# Patient Record
Sex: Male | Born: 1975 | Race: White | Hispanic: No | Marital: Married | Smoking: Never smoker
Health system: Southern US, Community
[De-identification: ages and names within clinical notes are randomized; demographics above are authoritative.]

## PROBLEM LIST (undated history)

## (undated) DIAGNOSIS — F419 Anxiety disorder, unspecified: Secondary | ICD-10-CM

## (undated) DIAGNOSIS — K219 Gastro-esophageal reflux disease without esophagitis: Secondary | ICD-10-CM

## (undated) HISTORY — PX: KNEE SURGERY: SHX244

## (undated) HISTORY — PX: HEMORRHOID SURGERY: SHX153

---

## 2010-08-16 DIAGNOSIS — K2100 Gastro-esophageal reflux disease with esophagitis, without bleeding: Secondary | ICD-10-CM | POA: Insufficient documentation

## 2010-08-16 DIAGNOSIS — J4520 Mild intermittent asthma, uncomplicated: Secondary | ICD-10-CM | POA: Insufficient documentation

## 2010-08-16 DIAGNOSIS — G43009 Migraine without aura, not intractable, without status migrainosus: Secondary | ICD-10-CM | POA: Insufficient documentation

## 2011-10-23 ENCOUNTER — Emergency Department (HOSPITAL_BASED_OUTPATIENT_CLINIC_OR_DEPARTMENT_OTHER)
Admission: EM | Admit: 2011-10-23 | Discharge: 2011-10-23 | Disposition: A | Discharge status: Home / Self Care | Payer: 59 | Attending: Emergency Medicine | Admitting: Emergency Medicine

## 2011-10-23 ENCOUNTER — Encounter (HOSPITAL_BASED_OUTPATIENT_CLINIC_OR_DEPARTMENT_OTHER): Payer: Self-pay | Admitting: *Deleted

## 2011-10-23 DIAGNOSIS — K6289 Other specified diseases of anus and rectum: Secondary | ICD-10-CM

## 2011-10-23 DIAGNOSIS — Z880 Allergy status to penicillin: Secondary | ICD-10-CM | POA: Insufficient documentation

## 2011-10-23 DIAGNOSIS — Z882 Allergy status to sulfonamides status: Secondary | ICD-10-CM | POA: Insufficient documentation

## 2011-10-23 DIAGNOSIS — F411 Generalized anxiety disorder: Secondary | ICD-10-CM | POA: Insufficient documentation

## 2011-10-23 DIAGNOSIS — K645 Perianal venous thrombosis: Secondary | ICD-10-CM

## 2011-10-23 HISTORY — DX: Anxiety disorder, unspecified: F41.9

## 2011-10-23 MED ORDER — OXYCODONE-ACETAMINOPHEN 5-325 MG PO TABS: 1.0000 | ORAL_TABLET | Freq: Four times a day (QID) | ORAL | Status: AC | PRN

## 2011-10-23 MED ORDER — MORPHINE SULFATE 4 MG/ML IJ SOLN
8.0000 mg | Freq: Once | INTRAMUSCULAR | Status: AC
  Administered 2011-10-23: 8 mg via INTRAMUSCULAR
  Filled 2011-10-23: qty 2

## 2011-10-23 NOTE — ED Notes (Signed)
Had a thrombosed hemorrhoid removed today. Bleeding wont stop. He is having problems urinating and rectal pain.

## 2011-10-23 NOTE — ED Provider Notes (Signed)
History     CSN: [id number]  Arrival date & time 10/23/11  9562   First MD Initiated Contact with Patient 10/23/11 1908      Chief Complaint  Patient presents with  . Rectal Pain    (Consider location/radiation/quality/duration/timing/severity/associated sxs/prior treatment) HPI Comments: Patient had thrombosed external hemorrhoids removed today.  Is having severe pain since that time.  He called the surgeons who told him to take motrin which has not helped.  They continue to bleed.  The history is provided by the patient.    Past Medical History  Diagnosis Date  . Anxiety     Past Surgical History  Procedure Date  . Hemorrhoid surgery   . Knee surgery     No family history on file.  History  Substance Use Topics  . Smoking status: Never Smoker   . Smokeless tobacco: Not on file  . Alcohol Use: Yes      Review of Systems  All other systems reviewed and are negative.    Allergies  Sulfa antibiotics and Penicillins  Home Medications   Current Outpatient Rx  Name Route Sig Dispense Refill  . AMITRIPTYLINE HCL 50 MG PO TABS Oral Take 75 mg by mouth at bedtime.    Marland Kitchen CLONAZEPAM 0.5 MG PO TABS Oral Take 0.5 mg by mouth daily.    Marland Kitchen HYDROCODONE-ACETAMINOPHEN 5-500 MG PO TABS Oral Take 1 tablet by mouth once as needed. For pain    . IBUPROFEN 200 MG PO TABS Oral Take 400 mg by mouth every 6 (six) hours as needed. For pain    . RANITIDINE HCL 150 MG PO TABS Oral Take 150 mg by mouth 2 (two) times daily.      BP 149/90  Pulse 99  Temp 97.5 F (36.4 C) (Oral)  Resp 20  SpO2 99%  Physical Exam  Nursing note and vitals reviewed. Constitutional: He is oriented to person, place, and time. He appears well-developed and well-nourished. No distress.       Appears uncomfortable.  HENT:  Head: Normocephalic and atraumatic.  Neck: Normal range of motion. Neck supple.  Genitourinary:       There are incisions present on the rectum which continue to ooze, but not  actively bleed.    Neurological: He is alert and oriented to person, place, and time.  Skin: Skin is warm and dry. He is not diaphoretic.    ED Course  Procedures (including critical care time)  Labs Reviewed - No data to display No results found.   No diagnosis found.    MDM  Will give pain meds, recommend stool softeners.  If he is still in this much pain tomorrow, he needs to see his Careers adviser.        Geoffery Lyons, MD 10/23/11 1919

## 2011-10-23 NOTE — ED Notes (Signed)
States has not taken gauze out but thinks he is bleeding pretty good.  Also states he cannot urinate and requested a catheter.

## 2011-12-03 ENCOUNTER — Emergency Department (HOSPITAL_BASED_OUTPATIENT_CLINIC_OR_DEPARTMENT_OTHER)
Admission: EM | Admit: 2011-12-03 | Discharge: 2011-12-03 | Disposition: A | Discharge status: Home / Self Care | Payer: 59 | Attending: Emergency Medicine | Admitting: Emergency Medicine

## 2011-12-03 ENCOUNTER — Emergency Department (HOSPITAL_BASED_OUTPATIENT_CLINIC_OR_DEPARTMENT_OTHER): Payer: 59

## 2011-12-03 ENCOUNTER — Encounter (HOSPITAL_BASED_OUTPATIENT_CLINIC_OR_DEPARTMENT_OTHER): Payer: Self-pay | Admitting: Emergency Medicine

## 2011-12-03 DIAGNOSIS — L0390 Cellulitis, unspecified: Secondary | ICD-10-CM

## 2011-12-03 DIAGNOSIS — Z79899 Other long term (current) drug therapy: Secondary | ICD-10-CM | POA: Insufficient documentation

## 2011-12-03 DIAGNOSIS — Z9889 Other specified postprocedural states: Secondary | ICD-10-CM | POA: Insufficient documentation

## 2011-12-03 DIAGNOSIS — IMO0002 Reserved for concepts with insufficient information to code with codable children: Secondary | ICD-10-CM | POA: Insufficient documentation

## 2011-12-03 DIAGNOSIS — F411 Generalized anxiety disorder: Secondary | ICD-10-CM | POA: Insufficient documentation

## 2011-12-03 MED ORDER — TRAMADOL HCL 50 MG PO TABS
50.0000 mg | ORAL_TABLET | Freq: Once | ORAL | Status: AC
  Administered 2011-12-03: 50 mg via ORAL
  Filled 2011-12-03: qty 1

## 2011-12-03 MED ORDER — DOXYCYCLINE HYCLATE 100 MG PO TABS
100.0000 mg | ORAL_TABLET | Freq: Once | ORAL | Status: AC
  Administered 2011-12-03: 100 mg via ORAL
  Filled 2011-12-03: qty 1

## 2011-12-03 MED ORDER — HYDROCODONE-ACETAMINOPHEN 5-500 MG PO TABS: 1.0000 | ORAL_TABLET | Freq: Four times a day (QID) | ORAL | Status: DC | PRN

## 2011-12-03 MED ORDER — KETOROLAC TROMETHAMINE 60 MG/2ML IM SOLN
60.0000 mg | Freq: Once | INTRAMUSCULAR | Status: AC
  Administered 2011-12-03: 60 mg via INTRAMUSCULAR
  Filled 2011-12-03: qty 2

## 2011-12-03 MED ORDER — DOXYCYCLINE HYCLATE 100 MG PO CAPS: 100.0000 mg | ORAL_CAPSULE | Freq: Two times a day (BID) | ORAL | Status: DC

## 2011-12-03 MED ORDER — LIDOCAINE HCL 2 % IJ SOLN
INTRAMUSCULAR | Status: AC
  Filled 2011-12-03: qty 20

## 2011-12-03 NOTE — ED Notes (Signed)
Pt /o swelling and pain in right elbow x 3 days

## 2011-12-03 NOTE — ED Provider Notes (Signed)
History     CSN: [id number]  Arrival date & time 12/03/11  0139   First MD Initiated Contact with Patient 12/03/11 0158      Chief Complaint  Patient presents with  . Joint Swelling    (Consider location/radiation/quality/duration/timing/severity/associated sxs/prior treatment) Patient is a 36 y.o. male presenting with rash.  Rash  This is a new problem. The current episode started more than 2 days ago. The problem has been gradually worsening. Associated with: gets burned on skin from welding. There has been no fever. Affected Location: right elbow. The pain is severe. The pain has been worsening since onset. Associated symptoms include pain. He has tried nothing for the symptoms. The treatment provided no relief.  Redness and swelling over the right elbow x 3 days.  No f/c/r.  No n/v/d.  No streaking up the arm nor gland swelling.    Past Medical History  Diagnosis Date  . Anxiety     Past Surgical History  Procedure Date  . Hemorrhoid surgery   . Knee surgery     No family history on file.  History  Substance Use Topics  . Smoking status: Never Smoker   . Smokeless tobacco: Not on file  . Alcohol Use: Yes      Review of Systems  Constitutional: Negative for fever.  Musculoskeletal: Positive for arthralgias.  Skin: Positive for rash.  All other systems reviewed and are negative.    Allergies  Sulfa antibiotics and Penicillins  Home Medications   Current Outpatient Rx  Name Route Sig Dispense Refill  . AMITRIPTYLINE HCL 50 MG PO TABS Oral Take 75 mg by mouth at bedtime.    Marland Kitchen CLONAZEPAM 0.5 MG PO TABS Oral Take 0.5 mg by mouth daily.    Marland Kitchen RANITIDINE HCL 150 MG PO TABS Oral Take 150 mg by mouth 2 (two) times daily.    Marland Kitchen HYDROCODONE-ACETAMINOPHEN 5-500 MG PO TABS Oral Take 1 tablet by mouth once as needed. For pain    . IBUPROFEN 200 MG PO TABS Oral Take 400 mg by mouth every 6 (six) hours as needed. For pain      BP 132/81  Pulse 104  Temp 97.1 F  (36.2 C)  Resp 18  Ht 5\' 6"  (1.676 m)  Wt 158 lb (71.668 kg)  BMI 25.50 kg/m2  SpO2 99%  Physical Exam  Constitutional: He is oriented to person, place, and time. He appears well-developed and well-nourished. No distress.  HENT:  Head: Normocephalic and atraumatic.  Mouth/Throat: Oropharynx is clear and moist.  Eyes: Conjunctivae normal are normal. Pupils are equal, round, and reactive to light.  Neck: Normal range of motion. Neck supple.  Cardiovascular: Normal rate, regular rhythm and intact distal pulses.   Pulmonary/Chest: Effort normal and breath sounds normal. He has no wheezes. He has no rales.  Abdominal: Soft. Bowel sounds are normal. There is no tenderness. There is no rebound and no guarding.  Musculoskeletal: Normal range of motion.  Lymphadenopathy:    He has no cervical adenopathy.  Neurological: He is alert and oriented to person, place, and time. He has normal reflexes.  Skin: Skin is warm and dry. There is erythema.     Psychiatric: He has a normal mood and affect.    ED Course  Procedures (including critical care time)  Labs Reviewed - No data to display No results found.   No diagnosis found.    MDM  Cellulitis.  Return for recheck in 2 day, sooner for fever  or streaking up the arm. if swelling or decreased range of motion follow up with orthopedics.  Patient verbalizes understanding and agrees to follow up       Crosley Stejskal Smitty Cords, MD 12/03/11 [phone]

## 2011-12-05 ENCOUNTER — Encounter (HOSPITAL_BASED_OUTPATIENT_CLINIC_OR_DEPARTMENT_OTHER): Payer: Self-pay | Admitting: Family Medicine

## 2011-12-05 ENCOUNTER — Emergency Department (HOSPITAL_BASED_OUTPATIENT_CLINIC_OR_DEPARTMENT_OTHER)
Admission: EM | Admit: 2011-12-05 | Discharge: 2011-12-05 | Disposition: A | Discharge status: Home / Self Care | Payer: Managed Care, Other (non HMO) | Attending: Emergency Medicine | Admitting: Emergency Medicine

## 2011-12-05 DIAGNOSIS — M7989 Other specified soft tissue disorders: Secondary | ICD-10-CM | POA: Insufficient documentation

## 2011-12-05 DIAGNOSIS — IMO0002 Reserved for concepts with insufficient information to code with codable children: Secondary | ICD-10-CM | POA: Insufficient documentation

## 2011-12-05 DIAGNOSIS — F411 Generalized anxiety disorder: Secondary | ICD-10-CM | POA: Insufficient documentation

## 2011-12-05 DIAGNOSIS — K219 Gastro-esophageal reflux disease without esophagitis: Secondary | ICD-10-CM | POA: Insufficient documentation

## 2011-12-05 DIAGNOSIS — L0390 Cellulitis, unspecified: Secondary | ICD-10-CM

## 2011-12-05 DIAGNOSIS — Z79899 Other long term (current) drug therapy: Secondary | ICD-10-CM | POA: Insufficient documentation

## 2011-12-05 MED ORDER — ESOMEPRAZOLE MAGNESIUM 40 MG PO CPDR: 40.0000 mg | DELAYED_RELEASE_CAPSULE | Freq: Every day | ORAL | Status: DC

## 2011-12-05 MED ORDER — CEFTRIAXONE SODIUM 1 G IJ SOLR: 1.0000 g | Freq: Once | INTRAMUSCULAR | Status: DC

## 2011-12-05 MED ORDER — LIDOCAINE HCL (PF) 1 % IJ SOLN
INTRAMUSCULAR | Status: AC
  Administered 2011-12-05: 5 mL
  Filled 2011-12-05: qty 5

## 2011-12-05 MED ORDER — CEFTRIAXONE SODIUM 1 G IJ SOLR
1.0000 g | Freq: Once | INTRAMUSCULAR | Status: AC
  Administered 2011-12-05: 1 g via INTRAMUSCULAR

## 2011-12-05 MED ORDER — CEFTRIAXONE SODIUM 1 G IJ SOLR
1.0000 g | Freq: Once | INTRAMUSCULAR | Status: DC
  Filled 2011-12-05: qty 10

## 2011-12-05 MED ORDER — MELOXICAM 15 MG PO TABS: 15.0000 mg | ORAL_TABLET | Freq: Every day | ORAL | Status: DC

## 2011-12-05 NOTE — ED Provider Notes (Signed)
History     CSN: [id number]  Arrival date & time 12/05/11  1129   First MD Initiated Contact with Patient 12/05/11 1208      Chief Complaint  Patient presents with  . Follow-up    (Consider location/radiation/quality/duration/timing/severity/associated sxs/prior treatment) HPI Comments: Quintez Maselli 36 y.o. male   The chief complaint is: Patient presents with:   Follow-up   36 year old male presents for followup on right elbow cellulitis.  Patient was seen today to days ago and has had 2 days of doxycycline.  Patient states that he still has significant pain and swelling.  He states that his elbow was never very red so redness has not decreased significantly.  Patient feels that his elbow is doing "the same."  Patient states that Vicodin does not work well for his pain.  His GERD is upset by 100 mg ibuprofen.  Patient continues to work as he states "I have to work". Denies systemic symptoms.  Denies numbness or tingling in the right hand.  Denies loss of range of motion , but ROM limited due to pain.  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   The history is provided by the patient and medical records. No language interpreter was used.    Past Medical History  Diagnosis Date  . Anxiety     Past Surgical History  Procedure Date  . Hemorrhoid surgery   . Knee surgery     No family history on file.  History  Substance Use Topics  . Smoking status: Never Smoker   . Smokeless tobacco: Not on file  . Alcohol Use: Yes      Review of Systems  Constitutional: Negative for fever and chills.  Respiratory: Negative for cough and shortness of breath.   Cardiovascular: Negative for chest pain and palpitations.  Gastrointestinal: Negative for vomiting, abdominal pain, diarrhea and constipation.  Genitourinary: Negative for dysuria, urgency and frequency.  Musculoskeletal: Positive for joint swelling (right elbow). Negative for myalgias,  arthralgias and gait problem.  Skin: Negative for wound.  Neurological: Negative for numbness and headaches.  All other systems reviewed and are negative.    Allergies  Sulfa antibiotics and Penicillins  Home Medications   Current Outpatient Rx  Name Route Sig Dispense Refill  . AMITRIPTYLINE HCL 50 MG PO TABS Oral Take 75 mg by mouth at bedtime.    Marland Kitchen CLONAZEPAM 0.5 MG PO TABS Oral Take 0.5 mg by mouth daily.    Marland Kitchen DOXYCYCLINE HYCLATE 100 MG PO CAPS Oral Take 1 capsule (100 mg total) by mouth 2 (two) times daily. 14 capsule 0  . HYDROCODONE-ACETAMINOPHEN 5-500 MG PO TABS Oral Take 1 tablet by mouth once as needed. For pain    . HYDROCODONE-ACETAMINOPHEN 5-500 MG PO TABS Oral Take 1 tablet by mouth every 6 (six) hours as needed for pain. 10 tablet 0  . IBUPROFEN 200 MG PO TABS Oral Take 400 mg by mouth every 6 (six) hours as needed. For pain    . RANITIDINE HCL 150 MG PO TABS Oral Take 150 mg by mouth 2 (two) times daily.      BP 132/93  Pulse 93  Temp 98.3 F (36.8 C) (Oral)  Resp 16  SpO2 99%  Physical Exam  Nursing note and vitals reviewed. Constitutional: He appears well-developed and well-nourished. No distress.  HENT:  Head: Normocephalic and atraumatic.  Eyes: Conjunctivae normal are normal. No scleral icterus.  Neck: Normal range of motion. Neck supple.  Cardiovascular: Normal rate, regular rhythm and normal heart sounds.   Pulmonary/Chest: Effort normal and breath sounds normal. No respiratory distress.  Abdominal: Soft. There is no tenderness.  Musculoskeletal: He exhibits edema.       A right elbow exam was performed. SKIN: intact, moderate erythema and wheel on elbow along with small burns to skin SWELLING: none, minimal and erythema  16cm x 10cm EFFUSION: none WARMTH: mildly increased warmth TENDERNESS: moderate, diffuse ROM: full STRENGTH: normal NEUROLOGICAL EXAM: normal VASCULAR EXAM: normal   Neurological: He is alert.  Skin: Skin is warm and dry.  He is not diaphoretic.  Psychiatric: His behavior is normal.    ED Course  Procedures (including critical care time)  Labs Reviewed - No data to display No results found.   No diagnosis found.    MDM  12:45 PM Filed Vitals:   12/05/11 1151  BP: 132/93  Pulse: 93  Temp: 98.3 F (36.8 C)  Resp: 16    Patient seen in shared visit with Dr. Radford Pax.  Patient will receive 1 gm rocephin IM .  PE does not suggest olecranon bursitis or septic joint.  Xray 2 days ago negative. Patient will be discharged with mobic and changed to nexium for GERD sxs.  I have marked the area with surgical pen and asked the patient to watch for resolution.  He should return for follow up if no improvent in 2-3 days.        Arthor Captain, PA-C 12/05/11 2225

## 2011-12-05 NOTE — ED Notes (Signed)
Pt sts he is here for f/u for pain, redness, swelling to right elbow. Pt sts he is taking abx, pain meds, without relief.

## 2011-12-05 NOTE — ED Notes (Signed)
No allergic reaction to the antibiotic.

## 2011-12-10 NOTE — ED Provider Notes (Signed)
Medical screening examination/treatment/procedure(s) were conducted as a shared visit with non-physician practitioner(s) and myself.  I personally evaluated the patient during the encounter    Nelia Shi, MD 12/10/11 2230

## 2012-05-02 ENCOUNTER — Encounter (HOSPITAL_BASED_OUTPATIENT_CLINIC_OR_DEPARTMENT_OTHER): Payer: Self-pay | Admitting: *Deleted

## 2012-05-02 ENCOUNTER — Emergency Department (HOSPITAL_BASED_OUTPATIENT_CLINIC_OR_DEPARTMENT_OTHER): Payer: 59

## 2012-05-02 ENCOUNTER — Emergency Department (HOSPITAL_BASED_OUTPATIENT_CLINIC_OR_DEPARTMENT_OTHER)
Admission: EM | Admit: 2012-05-02 | Discharge: 2012-05-02 | Disposition: A | Discharge status: Home / Self Care | Payer: 59 | Attending: Emergency Medicine | Admitting: Emergency Medicine

## 2012-05-02 DIAGNOSIS — F411 Generalized anxiety disorder: Secondary | ICD-10-CM | POA: Insufficient documentation

## 2012-05-02 DIAGNOSIS — R05 Cough: Secondary | ICD-10-CM | POA: Insufficient documentation

## 2012-05-02 DIAGNOSIS — R042 Hemoptysis: Secondary | ICD-10-CM | POA: Insufficient documentation

## 2012-05-02 DIAGNOSIS — Z79899 Other long term (current) drug therapy: Secondary | ICD-10-CM | POA: Insufficient documentation

## 2012-05-02 DIAGNOSIS — R059 Cough, unspecified: Secondary | ICD-10-CM

## 2012-05-02 LAB — CBC WITH DIFFERENTIAL/PLATELET
Basophils Absolute: 0 10*3/uL (ref 0.0–0.1)
Eosinophils Relative: 2 % (ref 0–5)
HCT: 42.5 % (ref 39.0–52.0)
Lymphocytes Relative: 14 % (ref 12–46)
Lymphs Abs: 1.3 10*3/uL (ref 0.7–4.0)
MCV: 86.9 fL (ref 78.0–100.0)
Monocytes Relative: 10 % (ref 3–12)
Platelets: 290 10*3/uL (ref 150–400)
RBC: 4.89 MIL/uL (ref 4.22–5.81)
RDW: 13.3 % (ref 11.5–15.5)
WBC: 9.6 10*3/uL (ref 4.0–10.5)

## 2012-05-02 LAB — BASIC METABOLIC PANEL
CO2: 29 mEq/L (ref 19–32)
Chloride: 100 mEq/L (ref 96–112)
Creatinine, Ser: 1 mg/dL (ref 0.50–1.35)
GFR calc Af Amer: >90 mL/min (ref >90)
Potassium: 4.4 mEq/L (ref 3.5–5.1)

## 2012-05-02 LAB — D-DIMER, QUANTITATIVE: D-Dimer, Quant: <0.27 ug/mL-FEU (ref 0.00–0.48)

## 2012-05-02 MED ORDER — HYDROCOD POLST-CHLORPHEN POLST 10-8 MG/5ML PO LQCR: 5.0000 mL | Freq: Two times a day (BID) | ORAL | Status: DC | PRN

## 2012-05-02 NOTE — ED Provider Notes (Signed)
History    This chart was scribed for Nelia Shi, MD by Toya Smothers, ED Scribe. The patient was seen in room MH04/MH04. Patient's care was started at 1901.   CSN: [id number]  Arrival date & time 05/02/12  1901   First MD Initiated Contact with Patient 05/02/12 2006      Chief Complaint  Patient presents with  . Cough     Patient is a 37 y.o. male presenting with cough. The history is provided by the patient. No language interpreter was used.  Cough Associated symptoms: no fever and no wheezing    Devon Burch is a 37 y.o. male who presents to the Emergency Department complaining of 3 days of new, sudden onset, constant, gradually improving, moderate Hemoptysis after 1 week of constant severe cough. At onset sputum was "chunky bright red mixed with phlegm," and now it has regressed to a "mixed light red consistency." After seeing his PCP on Wednesday, Pt was given a steroid injection, Rx Z-pak, and instructed to go the ED if symptoms did not improve. With 2 days left of Z-pak, Pt reports no improvement with use. No weight loss, fever, chills, congestion, rhinorrhea, SOB, or n/v/d. Pt admits alcohol use, denying tobacco and illicit drug use. Occupation: Administrator, sports.   Past Medical History  Diagnosis Date  . Anxiety     Past Surgical History  Procedure Laterality Date  . Hemorrhoid surgery    . Knee surgery      History reviewed. No pertinent family history.  History  Substance Use Topics  . Smoking status: Never Smoker   . Smokeless tobacco: Not on file  . Alcohol Use: Yes     Review of Systems  Constitutional: Negative for fever.  Respiratory: Positive for cough. Negative for wheezing.   All other systems reviewed and are negative.    Allergies  Sulfa antibiotics and Penicillins  Home Medications   Current Outpatient Rx  Name  Route  Sig  Dispense  Refill  . amitriptyline (ELAVIL) 50 MG tablet   Oral   Take 75 mg by mouth at bedtime.         .  chlorpheniramine-HYDROcodone (TUSSIONEX PENNKINETIC ER) 10-8 MG/5ML LQCR   Oral   Take 5 mLs by mouth every 12 (twelve) hours as needed.   115 mL   0   . clonazePAM (KLONOPIN) 0.5 MG tablet   Oral   Take 0.5 mg by mouth daily.         Marland Kitchen doxycycline (VIBRAMYCIN) 100 MG capsule   Oral   Take 1 capsule (100 mg total) by mouth 2 (two) times daily.   14 capsule   0   . esomeprazole (NEXIUM) 40 MG capsule   Oral   Take 1 capsule (40 mg total) by mouth daily.   30 capsule   0   . HYDROcodone-acetaminophen (VICODIN) 5-500 MG per tablet   Oral   Take 1 tablet by mouth once as needed. For pain         . HYDROcodone-acetaminophen (VICODIN) 5-500 MG per tablet   Oral   Take 1 tablet by mouth every 6 (six) hours as needed for pain.   10 tablet   0   . ibuprofen (ADVIL,MOTRIN) 200 MG tablet   Oral   Take 400 mg by mouth every 6 (six) hours as needed. For pain         . meloxicam (MOBIC) 15 MG tablet   Oral   Take 1 tablet (15 mg  total) by mouth daily.   10 tablet   0     BP 136/86  Pulse 93  Temp(Src) 99 F (37.2 C) (Oral)  Resp 20  Ht 5\' 6"  (1.676 m)  Wt 158 lb (71.668 kg)  BMI 25.51 kg/m2  SpO2 96%  Physical Exam  Nursing note and vitals reviewed. Constitutional: He is oriented to person, place, and time. He appears well-developed and well-nourished. No distress.  HENT:  Head: Normocephalic and atraumatic.  Eyes: Pupils are equal, round, and reactive to light.  Neck: Normal range of motion.  Cardiovascular: Normal rate and intact distal pulses.   Pulmonary/Chest: No respiratory distress. He has no wheezes. He has no rales.  Abdominal: Normal appearance. He exhibits no distension.  Musculoskeletal: Normal range of motion.  Neurological: He is alert and oriented to person, place, and time. No cranial nerve deficit.  Skin: Skin is warm and dry. No rash noted.  Psychiatric: He has a normal mood and affect. His behavior is normal.    ED Course  Procedures   DIAGNOSTIC STUDIES: Oxygen Saturation is 96% on room air, adequate by my interpretation.    COORDINATION OF CARE: 19:48- Ordered DG Chest 2 View 1 time imaging 20:07- Evaluated Pt. Pt is awake, alert, and without distress. 20:12- Patient  understand and agree with initial ED impression and plan with expectations set for ED visit.   Labs Reviewed  CBC WITH DIFFERENTIAL  BASIC METABOLIC PANEL  D-DIMER, QUANTITATIVE   Dg Chest 2 View  05/02/2012  *RADIOLOGY REPORT*  Clinical Data: Anxiety.  Cough. Pt states that he has been coughing up blood since Friday  CHEST - 2 VIEW  Comparison: None.  Findings: Cardiac and mediastinal contours appear normal.  The lungs appear clear.  No pleural effusion is identified.  IMPRESSION:  No significant abnormality identified.  If the patient does have actual hemoptysis, chest CT may be warranted.   Original Report Authenticated By: Gaylyn Rong, M.D.      1. Cough   2. Hemoptysis       MDM  I personally performed the services described in this documentation, which was scribed in my presence. The recorded information has been reviewed and considered.    Nelia Shi, MD 05/02/12 2212

## 2012-05-02 NOTE — ED Notes (Signed)
Pt states he has had a cough since Thursday. Coughing up blood occasionally. Saw PCP. Given Z=pak. Told to come to ED if continued.

## 2012-05-04 ENCOUNTER — Encounter (HOSPITAL_COMMUNITY): Payer: Self-pay | Admitting: *Deleted

## 2012-05-04 ENCOUNTER — Emergency Department (HOSPITAL_COMMUNITY): Payer: 59

## 2012-05-04 ENCOUNTER — Emergency Department (HOSPITAL_COMMUNITY)
Admission: EM | Admit: 2012-05-04 | Discharge: 2012-05-04 | Disposition: A | Discharge status: Home / Self Care | Payer: 59 | Attending: Emergency Medicine | Admitting: Emergency Medicine

## 2012-05-04 DIAGNOSIS — R042 Hemoptysis: Secondary | ICD-10-CM

## 2012-05-04 DIAGNOSIS — K219 Gastro-esophageal reflux disease without esophagitis: Secondary | ICD-10-CM | POA: Insufficient documentation

## 2012-05-04 DIAGNOSIS — R062 Wheezing: Secondary | ICD-10-CM | POA: Insufficient documentation

## 2012-05-04 DIAGNOSIS — F411 Generalized anxiety disorder: Secondary | ICD-10-CM | POA: Insufficient documentation

## 2012-05-04 DIAGNOSIS — Z79899 Other long term (current) drug therapy: Secondary | ICD-10-CM | POA: Insufficient documentation

## 2012-05-04 HISTORY — DX: Gastro-esophageal reflux disease without esophagitis: K21.9

## 2012-05-04 LAB — COMPREHENSIVE METABOLIC PANEL
Albumin: 4.4 g/dL (ref 3.5–5.2)
Alkaline Phosphatase: 119 U/L — ABNORMAL HIGH (ref 39–117)
BUN: 13 mg/dL (ref 6–23)
CO2: 28 mEq/L (ref 19–32)
Chloride: 99 mEq/L (ref 96–112)
Creatinine, Ser: 1.06 mg/dL (ref 0.50–1.35)
GFR calc non Af Amer: >90 mL/min (ref >90)
Potassium: 4.4 mEq/L (ref 3.5–5.1)
Total Bilirubin: 0.4 mg/dL (ref 0.3–1.2)

## 2012-05-04 LAB — CBC WITH DIFFERENTIAL/PLATELET
Basophils Relative: 1 % (ref 0–1)
HCT: 42.4 % (ref 39.0–52.0)
Hemoglobin: 15 g/dL (ref 13.0–17.0)
Lymphocytes Relative: 21 % (ref 12–46)
Lymphs Abs: 1.5 10*3/uL (ref 0.7–4.0)
MCHC: 35.4 g/dL (ref 30.0–36.0)
Monocytes Absolute: 1.1 10*3/uL — ABNORMAL HIGH (ref 0.1–1.0)
Monocytes Relative: 16 % — ABNORMAL HIGH (ref 3–12)
Neutro Abs: 4.1 10*3/uL (ref 1.7–7.7)
Neutrophils Relative %: 60 % (ref 43–77)
RBC: 4.93 MIL/uL (ref 4.22–5.81)
WBC: 7 10*3/uL (ref 4.0–10.5)

## 2012-05-04 MED ORDER — PREDNISONE 20 MG PO TABS
40.0000 mg | ORAL_TABLET | Freq: Once | ORAL | Status: AC
  Administered 2012-05-04: 40 mg via ORAL
  Filled 2012-05-04: qty 2

## 2012-05-04 MED ORDER — PREDNISONE 20 MG PO TABS: 40.0000 mg | ORAL_TABLET | Freq: Every day | ORAL | Status: DC

## 2012-05-04 NOTE — ED Provider Notes (Signed)
History     CSN: [id number]  Arrival date & time 05/04/12  1719   First MD Initiated Contact with Patient 05/04/12 1825      Chief Complaint  Patient presents with  . Hemoptysis    (Consider location/radiation/quality/duration/timing/severity/associated sxs/prior treatment) HPI Devon Burch is a 37yo M limited pmh presents from home with hemoptysis. Pt was evaluated on 05/02/12 for similar concern and told to complete azithromycin and present for evaluation if 5 days after completion pt was still having symptoms. Pt had CXR at that time which showed no acute cardiopulm process. Pt completed Z-pak yesterday and has effective antitussive agent at home that he takes at night given that he is still working as a Psychologist, occupational. Pt has had no repeat fever/chills, chest pain, or nausea/vomiting/diarrhea.   Past Medical History  Diagnosis Date  . Anxiety   . GERD (gastroesophageal reflux disease)     Past Surgical History  Procedure Laterality Date  . Hemorrhoid surgery    . Knee surgery      No family history on file.  History  Substance Use Topics  . Smoking status: Never Smoker   . Smokeless tobacco: Not on file  . Alcohol Use: Yes      Review of Systems  Constitutional: Negative for fever, chills, diaphoresis and fatigue.  HENT: Positive for sore throat. Negative for sneezing, trouble swallowing and sinus pressure.   Respiratory: Positive for cough and shortness of breath. Negative for wheezing.   Cardiovascular: Negative for chest pain.  Gastrointestinal: Negative for nausea, vomiting, abdominal pain and diarrhea.  Genitourinary: Negative for dysuria and hematuria.  Skin: Negative for color change.  Neurological: Negative for dizziness, light-headedness and headaches.    Allergies  Sulfa antibiotics and Penicillins  Home Medications   Current Outpatient Rx  Name  Route  Sig  Dispense  Refill  . amitriptyline (ELAVIL) 50 MG tablet   Oral   Take 75 mg by mouth at  bedtime.         Marland Kitchen azithromycin (ZITHROMAX) 250 MG tablet   Oral   Take 250 mg by mouth daily.         . chlorpheniramine-HYDROcodone (TUSSIONEX PENNKINETIC ER) 10-8 MG/5ML LQCR   Oral   Take 5 mLs by mouth every 12 (twelve) hours as needed.   115 mL   0   . clonazePAM (KLONOPIN) 0.5 MG tablet   Oral   Take 0.5 mg by mouth daily.         . Multiple Vitamin (ONE-A-DAY MENS PO)   Oral   Take 1 tablet by mouth daily.         Marland Kitchen omeprazole (PRILOSEC) 10 MG capsule   Oral   Take 10 mg by mouth daily.         . ranitidine (ZANTAC) 150 MG tablet   Oral   Take 150 mg by mouth 2 (two) times daily.           BP 135/83  Pulse 79  Temp(Src) 98 F (36.7 C) (Oral)  Resp 20  Ht 5\' 6"  (1.676 m)  Wt 158 lb (71.668 kg)  BMI 25.51 kg/m2  SpO2 98%  Physical Exam  Constitutional: He is oriented to person, place, and time. He appears well-developed and well-nourished. No distress.  HENT:  Head: Normocephalic.  Right Ear: External ear normal.  Left Ear: External ear normal.  Eyes: Pupils are equal, round, and reactive to light.  Cardiovascular: Normal rate, regular rhythm and normal heart sounds.  No murmur heard. Pulmonary/Chest: Effort normal. He has wheezes. He has no rales.  Abdominal: Soft. He exhibits no distension. There is no tenderness. There is no guarding.  Neurological: He is alert and oriented to person, place, and time.  Skin: Skin is warm. He is not diaphoretic.    ED Course  Procedures (including critical care time)  Labs Reviewed  CBC WITH DIFFERENTIAL - Abnormal; Notable for the following:    Monocytes Relative 16 (*)    Monocytes Absolute 1.1 (*)    All other components within normal limits  COMPREHENSIVE METABOLIC PANEL - Abnormal; Notable for the following:    Alkaline Phosphatase 119 (*)    All other components within normal limits   Dg Chest 2 View  05/02/2012  *RADIOLOGY REPORT*  Clinical Data: Anxiety.  Cough. Pt states that he has  been coughing up blood since Friday  CHEST - 2 VIEW  Comparison: None.  Findings: Cardiac and mediastinal contours appear normal.  The lungs appear clear.  No pleural effusion is identified.  IMPRESSION:  No significant abnormality identified.  If the patient does have actual hemoptysis, chest CT may be warranted.   Original Report Authenticated By: Gaylyn Rong, M.D.      No diagnosis found.    MDM  Pt was stable. Pulmonology called and discussed case would like pt to take prednisone 40mg  daily for 5 days and be seen in their office this week. This was discussed with pt. CXR and labs nl and unchanged.   Pt seen and discussed with Dr. Linna Darner, MD 05/07/12 1320

## 2012-05-04 NOTE — ED Notes (Signed)
Pt was tx with a z-pak and a "shot" on Thurs for cough.  Fri he started coughing up blood.  MD stated to go to UC if continued.  Pt went to [facility] on Sunday and had neg X-rays and F-fimrt.  Pt c/o burning throat, pain when he coughs.  Pt drinks 3 beers/per night 4 nights a week.

## 2012-05-04 NOTE — ED Notes (Signed)
Patient given copy of discharge paperwork; went over discharge instructions with patient.  Patient instructed to follow up with referral first thing tomorrow morning and tell the secretary that he needs to be seen this week.  Patient verbalized understanding; instructed patient to return to the ED for new, worsening, or concerning symptoms.

## 2012-05-09 NOTE — ED Provider Notes (Signed)
I saw and evaluated the patient, reviewed the resident's note and I agree with the findings and plan.   .Face to face Exam:  General:  Awake HEENT:  Atraumatic Resp:  Normal effort Abd:  Nondistended Neuro:No focal weakness   Case discussed with pulmonary   Close f/u arranged.  Nelia Shi, MD 05/09/12 [phone]

## 2012-05-25 ENCOUNTER — Ambulatory Visit (INDEPENDENT_AMBULATORY_CARE_PROVIDER_SITE_OTHER): Payer: 59 | Admitting: Internal Medicine

## 2012-05-25 ENCOUNTER — Encounter: Payer: Self-pay | Admitting: Internal Medicine

## 2012-05-25 VITALS — BP 122/82 | HR 84 | Temp 98.0°F | Ht 66.0 in | Wt 153.6 lb

## 2012-05-25 DIAGNOSIS — R042 Hemoptysis: Secondary | ICD-10-CM

## 2012-05-25 DIAGNOSIS — R05 Cough: Secondary | ICD-10-CM

## 2012-05-25 DIAGNOSIS — R059 Cough, unspecified: Secondary | ICD-10-CM

## 2012-05-25 MED ORDER — RANITIDINE HCL 150 MG PO TABS: ORAL_TABLET | ORAL | Status: DC

## 2012-05-25 MED ORDER — PANTOPRAZOLE SODIUM 40 MG PO TBEC: 40.0000 mg | DELAYED_RELEASE_TABLET | Freq: Every day | ORAL | Status: AC

## 2012-05-25 NOTE — Patient Instructions (Addendum)
Pantoprazole (protonix) 40 mg   Take 30-60 min before first meal of the day and Zantac 150 x 2  bedtime until return to office - this is the best way to tell whether stomach acid is contributing to your problem.    GERD (REFLUX)  is an extremely common cause of respiratory symptoms, many times with no significant heartburn at all.    It can be treated with medication, but also with lifestyle changes including avoidance of late meals, excessive alcohol, smoking cessation, and avoid fatty foods, chocolate, peppermint, colas, red wine, and acidic juices such as orange juice.  NO MINT OR MENTHOL PRODUCTS SO NO COUGH DROPS  USE SUGARLESS CANDY INSTEAD (jolley ranchers or Stover's)  NO OIL BASED VITAMINS - use powdered substitutes.  Delsym 2 tsp twice daily as needed   Please schedule a follow up office visit in 4 weeks, sooner if needed  Add Sinus CT next step if not improving.

## 2012-05-25 NOTE — Progress Notes (Signed)
  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  05/25/2012 1st pulmonary eval/ Xzaviar Maloof cc much worse cough and sob x 3 weeks with green then hemoptysis eval by primary doc rx zpak then urgent care 3/23 pred , cough med, er 3/25.  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 Sob at rest, worse with any acitivity, hacking cough with traces of brb but no more green sputum and the amt of heme is traces now with scant mucoid sputum    Does have h/o bad gerd but takes zantac 150 mg with "keeps it down"  No obvious daytime variabilty or assoc chronic cough or cp or chest tightness, subjective wheeze overt sinus or hb symptoms. No unusual exp hx or h/o childhood pna  or premature birth to his knowledge.   Sleeping ok without nocturnal  or early am exacerbation  of respiratory  c/o's or need for noct saba. Also denies any obvious fluctuation of symptoms with weather or environmental changes or other aggravating or alleviating factors except as outlined above     Review of Systems  Constitutional: Positive for unexpected weight change. Negative for fever.  HENT: Negative for ear pain, nosebleeds, congestion, sore throat, rhinorrhea, sneezing, trouble swallowing, dental problem, postnasal drip and sinus pressure.   Eyes: Negative for redness and itching.  Respiratory: Positive for cough and shortness of breath. Negative for chest tightness and wheezing.   Cardiovascular: Positive for chest pain. Negative for palpitations and leg swelling.  Gastrointestinal: Negative for  nausea and vomiting.  Genitourinary: Negative for dysuria.  Musculoskeletal: Negative for joint swelling.  Skin: Negative for rash.  Neurological: Negative for headaches.  Hematological: Does not bruise/bleed easily.  Psychiatric/Behavioral: Positive for dysphoric mood. The patient is nervous/anxious.        Objective:   Physical Exam  amb wm nad Wt Readings from Last 3 Encounters:  05/25/12 153 lb 9.6 oz (69.673 kg)  05/04/12 158 lb (71.668 kg)  05/02/12 158 lb (71.668 kg)    HEENT: nl dentition, turbinates, and orophanx. Nl external ear canals without cough reflex   NECK :  without JVD/Nodes/TM/ nl carotid upstrokes bilaterally   LUNGS: no acc muscle use, clear to A and P bilaterally without cough on insp or exp maneuvers   CV:  RRR  no s3 or murmur or increase in P2, no edema   ABD:  soft and nontender with nl excursion in the supine position. No bruits or organomegaly, bowel sounds nl  MS:  warm without deformities, calf tenderness, cyanosis or clubbing  SKIN: warm and dry without lesions    NEURO:  alert, approp, no deficits    cxr 05/04/12  Central bronchitic changes. No interval change       Assessment & Plan:

## 2012-05-28 DIAGNOSIS — R05 Cough: Secondary | ICD-10-CM | POA: Insufficient documentation

## 2012-05-28 DIAGNOSIS — R059 Cough, unspecified: Secondary | ICD-10-CM | POA: Insufficient documentation

## 2012-05-28 DIAGNOSIS — R042 Hemoptysis: Secondary | ICD-10-CM | POA: Insufficient documentation

## 2012-05-28 NOTE — Assessment & Plan Note (Addendum)
Chronic cough preceded hemoptysis by months to years and exac with purulent exac which has now resolved and cxr does not suggest source so likely this is due to the cough and not the cause of the cough

## 2012-05-28 NOTE — Assessment & Plan Note (Signed)
The most common causes of chronic cough in immunocompetent adults include the following: upper airway cough syndrome (UACS), previously referred to as postnasal drip syndrome (PNDS), which is caused by variety of rhinosinus conditions; (2) asthma; (3) GERD; (4) chronic bronchitis from cigarette smoking or other inhaled environmental irritants; (5) nonasthmatic eosinophilic bronchitis; and (6) bronchiectasis.   These conditions, singly or in combination, have accounted for up to 94% of the causes of chronic cough in prospective studies.   Other conditions have constituted no >6% of the causes in prospective studies These have included bronchogenic carcinoma, chronic interstitial pneumonia, sarcoidosis, left ventricular failure, ACEI-induced cough, and aspiration from a condition associated with pharyngeal dysfunction.    Chronic cough is often simultaneously caused by more than one condition. A single cause has been found from 38 to 82% of the time, multiple causes from 18 to 62%. Multiply caused cough has been the result of three diseases up to 42% of the time.      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   These are the same pts (now being labeled as having "irritable larynx syndrome" by some cough centers) who not infrequently have a history of having failed to tolerate ace inhibitors,  dry powder inhalers or biphosphonates or report having atypical reflux symptoms that don't respond to standard doses of PPI , and are easily confused as having aecopd or asthma  flares by even experienced allergists/ pulmonologists.   Of the three most common causes of chronic cough, only one (GERD)  can actually cause the other two (asthma and post nasal drip syndrome)  and perpetuate the cylce of cough inducing airway trauma, inflammation, heightened sensitivity to reflux which is prompted by the cough itself via a cyclical mechanism.    This may partially respond to steroids and look like asthma and post nasal drainage but never erradicated completely unless the cough and the secondary reflux are eliminated, preferably both at the same time.  While not intuitively obvious, many patients with chronic low grade reflux do not cough until there is a secondary insult that disturbs the protective epithelial barrier and exposes sensitive nerve endings.  This can be viral or direct physical injury such as with an endotracheal tube.   The point is that once this occurs, it is difficult to eliminate using anything but a maximally effective acid suppression regimen at least in the short run, accompanied by an appropriate diet to address non acid GERD.   See instructions for specific recommendations which were reviewed directly with the patient who was given a copy with highlighter outlining the key components.  Sinus CT next step if not improving.

## 2012-06-21 ENCOUNTER — Ambulatory Visit: Payer: Self-pay

## 2012-06-21 ENCOUNTER — Other Ambulatory Visit: Payer: Self-pay | Admitting: Occupational Medicine

## 2012-06-21 DIAGNOSIS — R52 Pain, unspecified: Secondary | ICD-10-CM

## 2012-06-22 ENCOUNTER — Ambulatory Visit: Payer: 59 | Admitting: Internal Medicine

## 2012-07-20 ENCOUNTER — Ambulatory Visit: Payer: 59 | Admitting: Internal Medicine

## 2012-08-12 ENCOUNTER — Ambulatory Visit: Payer: 59 | Admitting: Internal Medicine

## 2013-01-23 ENCOUNTER — Encounter (HOSPITAL_BASED_OUTPATIENT_CLINIC_OR_DEPARTMENT_OTHER): Payer: Self-pay | Admitting: Emergency Medicine

## 2013-01-23 ENCOUNTER — Emergency Department (HOSPITAL_BASED_OUTPATIENT_CLINIC_OR_DEPARTMENT_OTHER)
Admission: EM | Admit: 2013-01-23 | Discharge: 2013-01-23 | Disposition: A | Discharge status: Home / Self Care | Payer: 59 | Attending: Emergency Medicine | Admitting: Emergency Medicine

## 2013-01-23 ENCOUNTER — Emergency Department (HOSPITAL_BASED_OUTPATIENT_CLINIC_OR_DEPARTMENT_OTHER): Payer: 59

## 2013-01-23 DIAGNOSIS — R05 Cough: Secondary | ICD-10-CM | POA: Insufficient documentation

## 2013-01-23 DIAGNOSIS — M546 Pain in thoracic spine: Secondary | ICD-10-CM

## 2013-01-23 DIAGNOSIS — Z880 Allergy status to penicillin: Secondary | ICD-10-CM | POA: Insufficient documentation

## 2013-01-23 DIAGNOSIS — Z79899 Other long term (current) drug therapy: Secondary | ICD-10-CM | POA: Insufficient documentation

## 2013-01-23 DIAGNOSIS — K219 Gastro-esophageal reflux disease without esophagitis: Secondary | ICD-10-CM | POA: Insufficient documentation

## 2013-01-23 DIAGNOSIS — F411 Generalized anxiety disorder: Secondary | ICD-10-CM | POA: Insufficient documentation

## 2013-01-23 DIAGNOSIS — R52 Pain, unspecified: Secondary | ICD-10-CM | POA: Insufficient documentation

## 2013-01-23 DIAGNOSIS — R059 Cough, unspecified: Secondary | ICD-10-CM | POA: Insufficient documentation

## 2013-01-23 MED ORDER — OXYCODONE-ACETAMINOPHEN 5-325 MG PO TABS: 2.0000 | ORAL_TABLET | Freq: Three times a day (TID) | ORAL | Status: DC | PRN

## 2013-01-23 NOTE — ED Provider Notes (Signed)
CSN: [id number]     Arrival date & time 01/23/13  7829 History   First MD Initiated Contact with Patient 01/23/13 1002     Chief Complaint  Patient presents with  . Back Pain   (Consider location/radiation/quality/duration/timing/severity/associated sxs/prior Treatment) HPI This 37 year old male has 2 weeks of atraumatic mid back pain without radiation or associated symptoms it is constant worse with position changes moderately severe positional nonexertional without pain down his arms or legs without weakness or numbness without change in bowel or bladder function, without abdominal pain nausea vomiting dysuria chest pain shortness of breath new cough fever rash, also without recent trauma history of IV drug abuse or back surgery, and there is no treatment prior to arrival. He is a chronic mild unchanged cough. He had urinalysis that was unremarkable according to the patient when he went to his primary care Dr. when this pain started. Past Medical History  Diagnosis Date  . Anxiety   . GERD (gastroesophageal reflux disease)    Past Surgical History  Procedure Laterality Date  . Hemorrhoid surgery    . Knee surgery     Family History  Problem Relation Age of Onset  . Emphysema Paternal Grandmother   . Heart disease Paternal Grandfather   . Heart disease Maternal Grandfather    History  Substance Use Topics  . Smoking status: Never Smoker   . Smokeless tobacco: Not on file  . Alcohol Use: Yes    Review of Systems 10 Systems reviewed and are negative for acute change except as noted in the HPI. Allergies  Sulfa antibiotics and Penicillins  Home Medications   Current Outpatient Rx  Name  Route  Sig  Dispense  Refill  . esomeprazole (NEXIUM) 10 MG packet   Oral   Take 10 mg by mouth daily before breakfast.         . sertraline (ZOLOFT) 25 MG tablet   Oral   Take 25 mg by mouth daily.         Marland Kitchen amitriptyline (ELAVIL) 50 MG tablet   Oral   Take 75 mg by mouth at  bedtime.         . clonazePAM (KLONOPIN) 0.5 MG tablet   Oral   Take 0.5 mg by mouth daily.         . Multiple Vitamin (ONE-A-DAY MENS PO)   Oral   Take 1 tablet by mouth daily.         Marland Kitchen oxyCODONE-acetaminophen (PERCOCET) 5-325 MG per tablet   Oral   Take 2 tablets by mouth every 8 (eight) hours as needed for severe pain.   10 tablet   0   . pantoprazole (PROTONIX) 40 MG tablet   Oral   Take 1 tablet (40 mg total) by mouth daily. Take 30-60 min before first meal of the day   30 tablet   2   . ranitidine (ZANTAC) 150 MG tablet      2 at bedtime          BP 136/89  Pulse 94  Temp(Src) 97.8 F (36.6 C) (Oral)  Resp 20  Ht 5\' 6"  (1.676 m)  Wt 162 lb (73.483 kg)  BMI 26.16 kg/m2  SpO2 98% Physical Exam  Nursing note and vitals reviewed. Constitutional:  Awake, alert, nontoxic appearance with baseline speech.  HENT:  Head: Atraumatic.  Eyes: Pupils are equal, round, and reactive to light. Right eye exhibits no discharge. Left eye exhibits no discharge.  Neck: Neck supple.  Cardiovascular: Normal rate and regular rhythm.   No murmur heard. Pulmonary/Chest: Effort normal and breath sounds normal. No respiratory distress. He has no wheezes. He has no rales. He exhibits no tenderness.  Abdominal: Soft. Bowel sounds are normal. He exhibits no mass. There is no tenderness. There is no rebound.  Musculoskeletal: He exhibits tenderness. He exhibits no edema.       Thoracic back: He exhibits no tenderness.       Lumbar back: He exhibits no tenderness.  Bilateral lower extremities non tender without new rashes or color change, baseline ROM with intact DP pulses, CR<2 secs all digits bilaterally, sensation baseline light touch bilaterally for pt, motor symmetric bilateral 5 / 5 hip flexion, quadriceps, hamstrings, EHL, foot dorsiflexion, foot plantarflexion, gait somewhat antalgic but without apparent new ataxia. Back has thoracic midline back tenderness only, worse with  torso position changes, the cervical spine is nontender lumbar spine is nontender  Neurological:  Mental status baseline for patient.  Upper extremity motor strength and sensation intact and symmetric bilaterally.  Skin: No rash noted.  Psychiatric: He has a normal mood and affect.    ED Course  Procedures (including critical care time) Patient / Family / Caregiver informed of clinical course, understand medical decision-making process, and agree with plan. Labs Review Labs Reviewed - No data to display Imaging Review Dg Thoracic Spine W/swimmers  01/23/2013   CLINICAL DATA:  Mid back pain  EXAM: THORACIC SPINE - 2 VIEW + SWIMMERS  COMPARISON:  05/04/2012  FINDINGS: There is no evidence of thoracic spine fracture. Alignment is normal. No other significant bone abnormalities are identified.  IMPRESSION: Negative.   Electronically Signed   By: Ruel Favors M.D.   On: 01/23/2013 10:28    EKG Interpretation   None       MDM   1. Acute thoracic back pain    I doubt any other EMC precluding discharge at this time including, but not necessarily limited to the following:SBI, cauda equina syndrome, cord syndrome.    Hurman Horn, MD 01/24/13 [phone]

## 2013-01-23 NOTE — ED Notes (Signed)
C/o back pain in the middle of his back, denies injury, states went to primary care doc 2 weeks ago for kidney, had UA, no antibiotics ordered, now states pain in mid back, states moving "takes breath away", states problems starting urination, denies frequency, burning

## 2013-03-13 DEATH — deceased

## 2013-08-02 DIAGNOSIS — K5000 Crohn's disease of small intestine without complications: Secondary | ICD-10-CM | POA: Insufficient documentation

## 2013-08-02 DIAGNOSIS — F4323 Adjustment disorder with mixed anxiety and depressed mood: Secondary | ICD-10-CM | POA: Insufficient documentation

## 2013-08-02 DIAGNOSIS — K2270 Barrett's esophagus without dysplasia: Secondary | ICD-10-CM | POA: Insufficient documentation

## 2013-12-28 DIAGNOSIS — N529 Male erectile dysfunction, unspecified: Secondary | ICD-10-CM | POA: Insufficient documentation

## 2013-12-31 ENCOUNTER — Emergency Department: Payer: Self-pay | Admitting: Emergency Medicine

## 2013-12-31 LAB — CBC
HCT: 46.4 % (ref 40.0–52.0)
HGB: 15.3 g/dL (ref 13.0–18.0)
MCH: 29.6 pg (ref 26.0–34.0)
MCHC: 33 g/dL (ref 32.0–36.0)
MCV: 90 fL (ref 80–100)
Platelet: 289 10*3/uL (ref 150–440)
RBC: 5.18 10*6/uL (ref 4.40–5.90)
RDW: 13 % (ref 11.5–14.5)
WBC: 10.4 10*3/uL (ref 3.8–10.6)

## 2013-12-31 LAB — COMPREHENSIVE METABOLIC PANEL
ALBUMIN: 4.2 g/dL (ref 3.4–5.0)
Alkaline Phosphatase: 101 U/L
Anion Gap: 8 (ref 7–16)
BUN: 10 mg/dL (ref 7–18)
Bilirubin,Total: 0.4 mg/dL (ref 0.2–1.0)
CALCIUM: 8.8 mg/dL (ref 8.5–10.1)
CHLORIDE: 107 mmol/L (ref 98–107)
Co2: 24 mmol/L (ref 21–32)
Creatinine: 1.01 mg/dL (ref 0.60–1.30)
EGFR (African American): >60
EGFR (Non-African Amer.): >60
GLUCOSE: 88 mg/dL (ref 65–99)
Osmolality: 276 (ref 275–301)
POTASSIUM: 3.8 mmol/L (ref 3.5–5.1)
SGOT(AST): 37 U/L (ref 15–37)
SGPT (ALT): 66 U/L — ABNORMAL HIGH
SODIUM: 139 mmol/L (ref 136–145)
TOTAL PROTEIN: 7.6 g/dL (ref 6.4–8.2)

## 2013-12-31 LAB — LIPASE, BLOOD: Lipase: 105 U/L (ref 73–393)

## 2014-02-01 IMAGING — CR DG CHEST 2V
2 series · 2 of 2 positions shown · non-contrast
Comparison: 05/02/2012

CLINICAL DATA: Hemoptysis, cough

CHEST - 2 VIEW

[w chest pa]
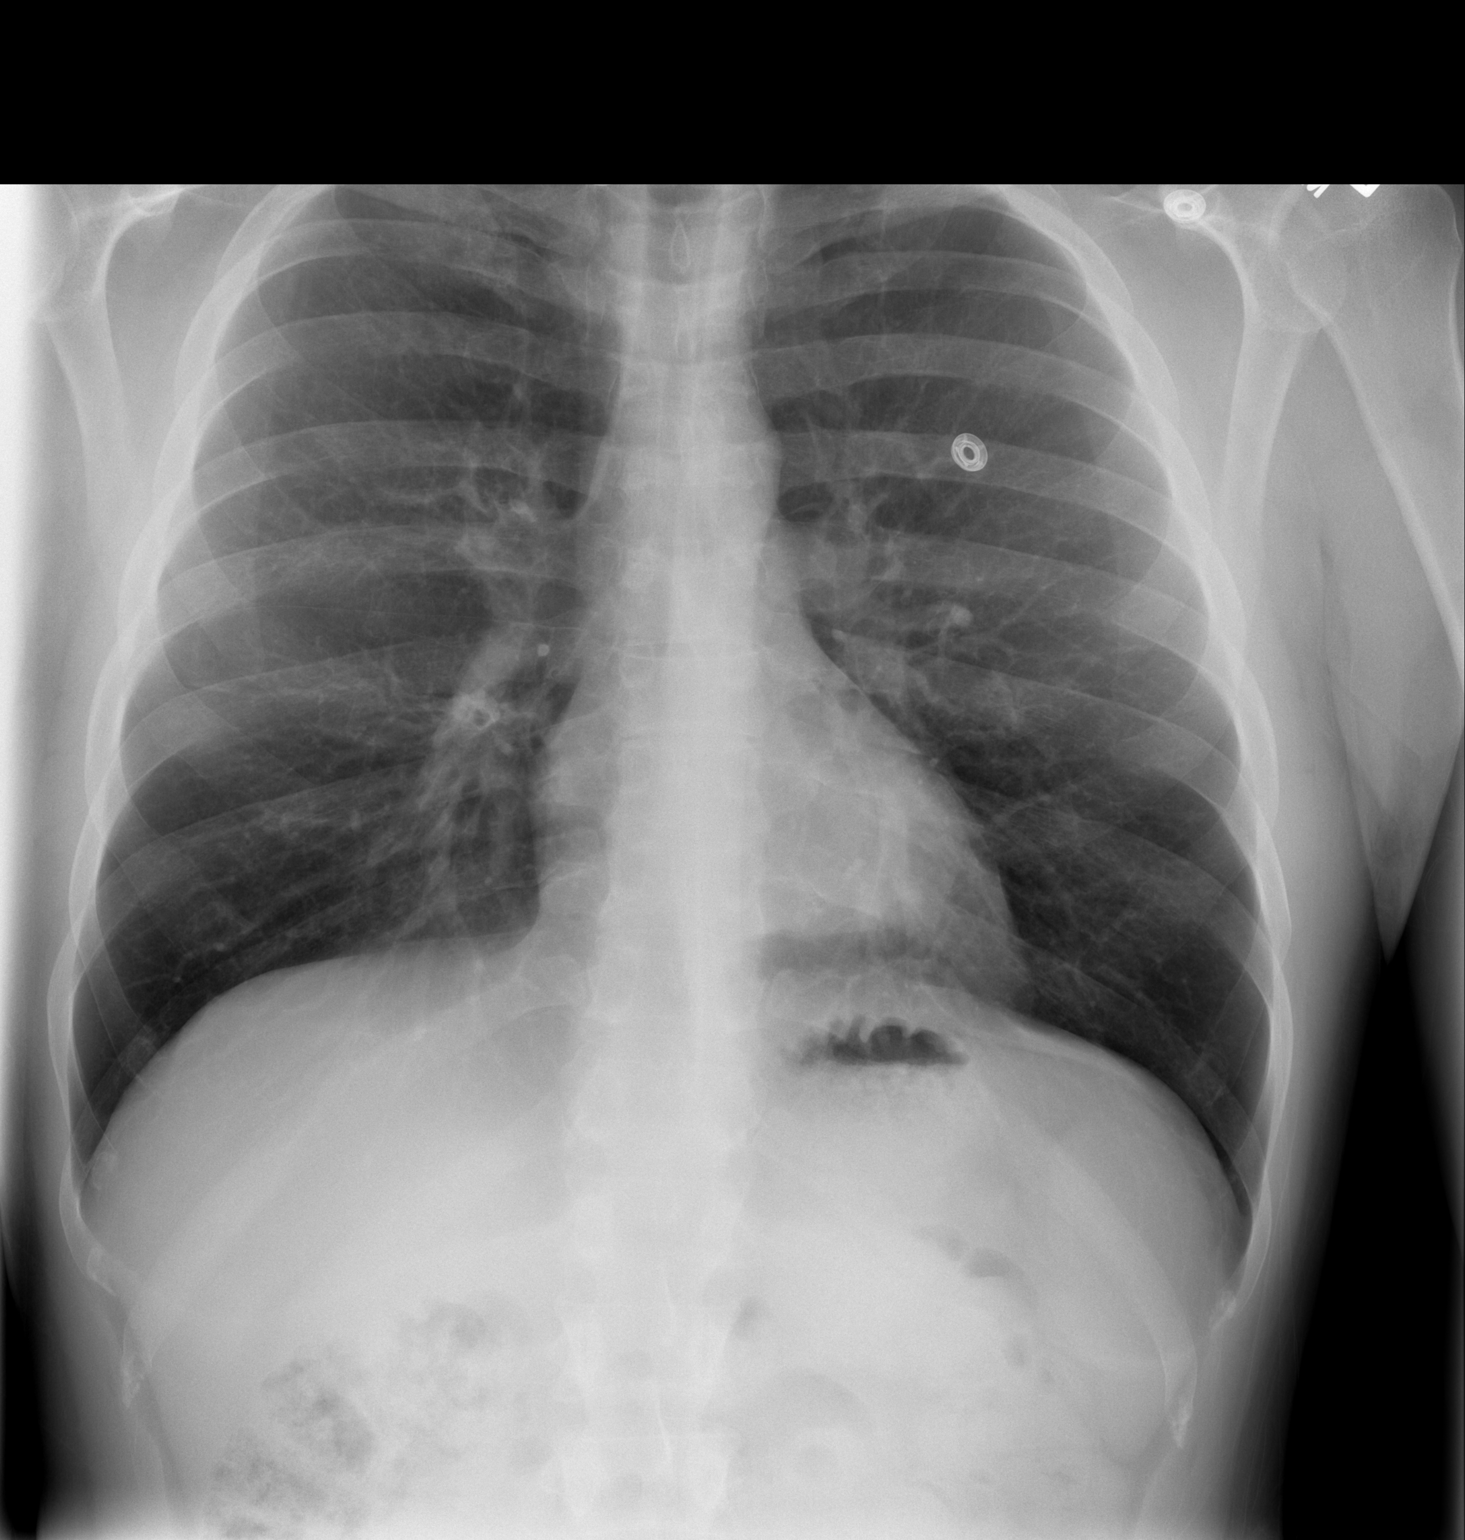

[w chest lat]
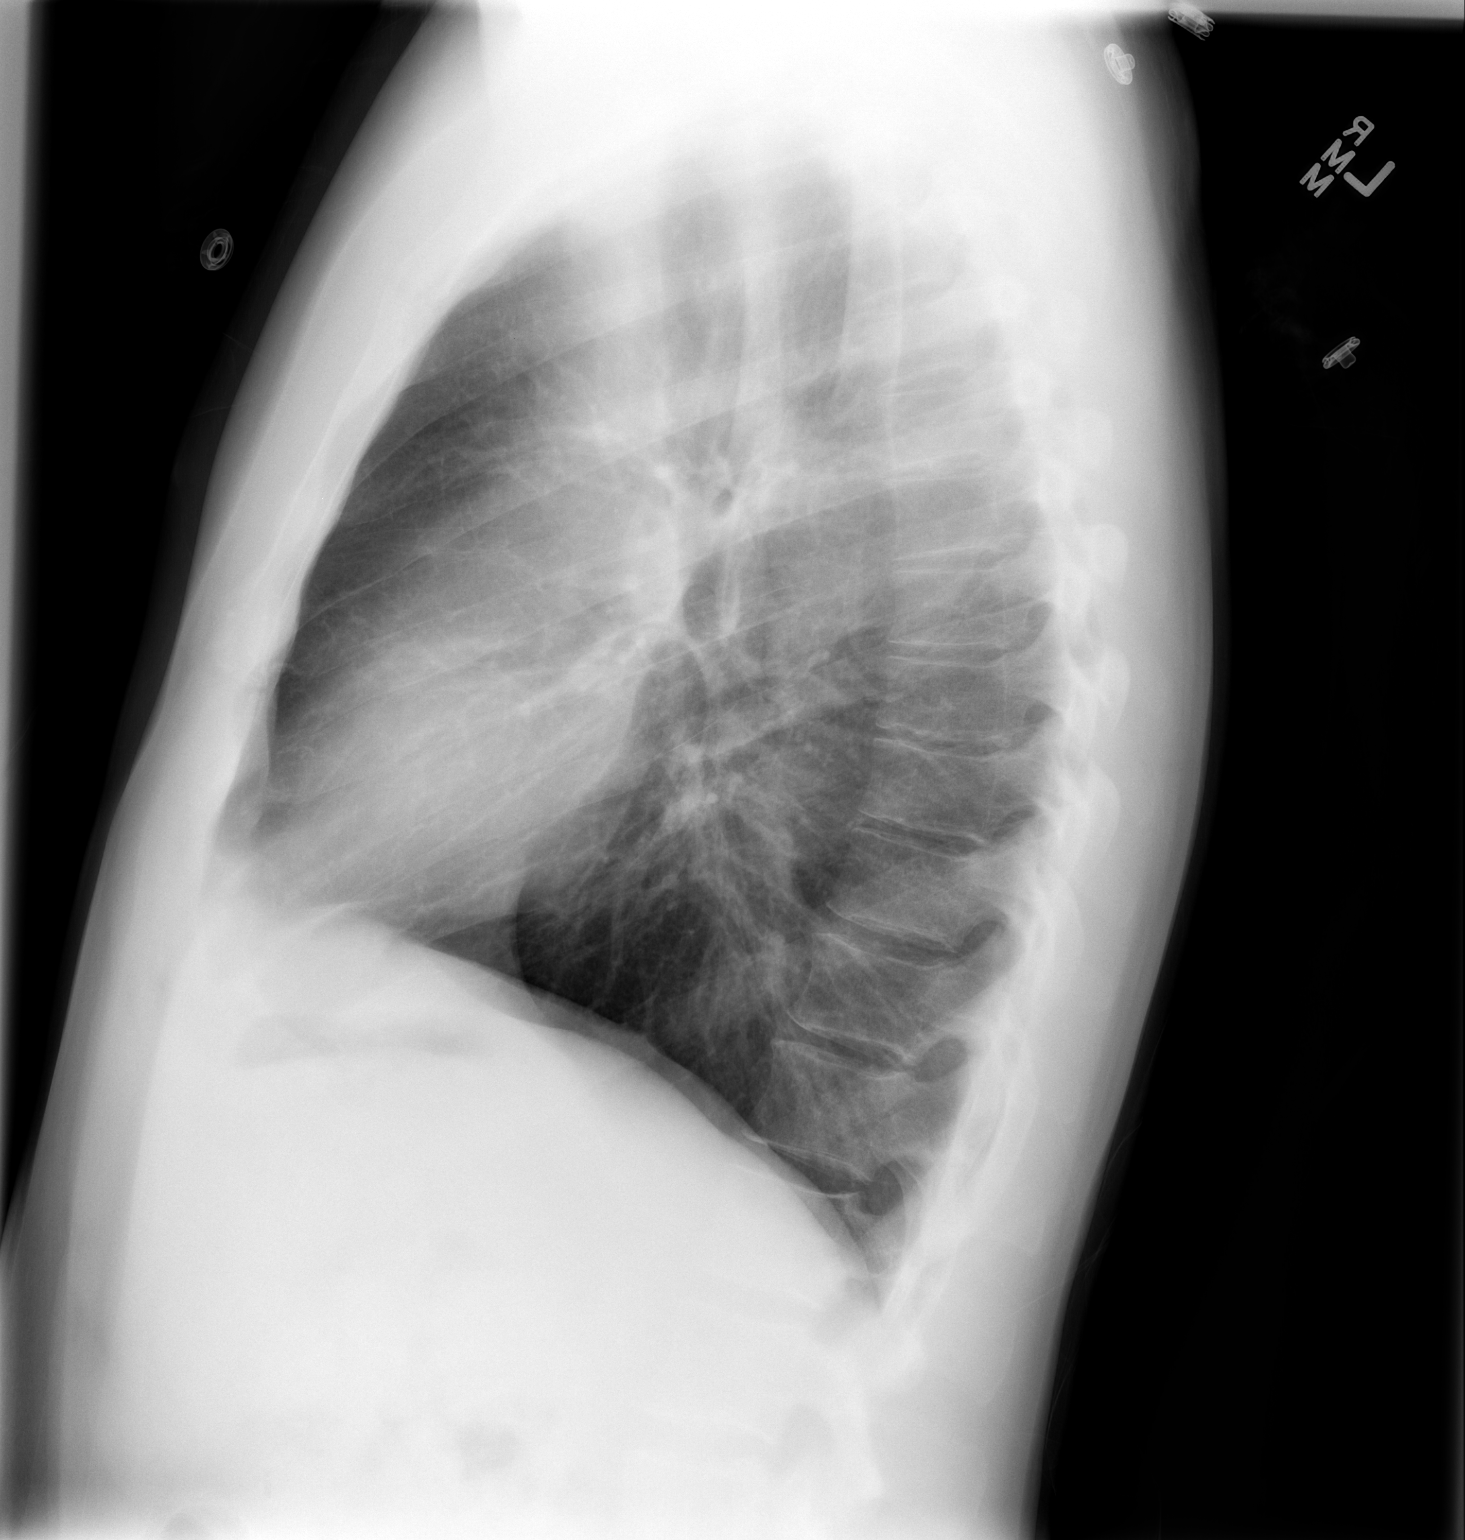

[2 of 2 positions shown; findings below may reference images not displayed]

FINDINGS: Normal heart size and vascularity.  Mild central
bronchitic changes as before.  No focal pneumonia, collapse,
consolidation, edema, effusion or pneumothorax.  Trachea is
midline.  No osseous abnormality.
IMPRESSION: Central bronchitic changes.  No interval change.

## 2014-05-09 DIAGNOSIS — J302 Other seasonal allergic rhinitis: Secondary | ICD-10-CM | POA: Insufficient documentation

## 2014-05-09 DIAGNOSIS — S069XAA Unspecified intracranial injury with loss of consciousness status unknown, initial encounter: Secondary | ICD-10-CM | POA: Insufficient documentation

## 2014-05-25 ENCOUNTER — Encounter (HOSPITAL_BASED_OUTPATIENT_CLINIC_OR_DEPARTMENT_OTHER): Payer: Self-pay

## 2014-05-25 ENCOUNTER — Emergency Department (HOSPITAL_BASED_OUTPATIENT_CLINIC_OR_DEPARTMENT_OTHER)
Admission: EM | Admit: 2014-05-25 | Discharge: 2014-05-25 | Disposition: A | Discharge status: Home / Self Care | Payer: 59 | Attending: Emergency Medicine | Admitting: Emergency Medicine

## 2014-05-25 DIAGNOSIS — R51 Headache: Secondary | ICD-10-CM | POA: Diagnosis not present

## 2014-05-25 DIAGNOSIS — F419 Anxiety disorder, unspecified: Secondary | ICD-10-CM | POA: Insufficient documentation

## 2014-05-25 DIAGNOSIS — Z880 Allergy status to penicillin: Secondary | ICD-10-CM | POA: Insufficient documentation

## 2014-05-25 DIAGNOSIS — K219 Gastro-esophageal reflux disease without esophagitis: Secondary | ICD-10-CM | POA: Insufficient documentation

## 2014-05-25 DIAGNOSIS — M542 Cervicalgia: Secondary | ICD-10-CM | POA: Insufficient documentation

## 2014-05-25 DIAGNOSIS — R519 Headache, unspecified: Secondary | ICD-10-CM

## 2014-05-25 DIAGNOSIS — Z79899 Other long term (current) drug therapy: Secondary | ICD-10-CM | POA: Insufficient documentation

## 2014-05-25 DIAGNOSIS — G4453 Primary thunderclap headache: Secondary | ICD-10-CM | POA: Insufficient documentation

## 2014-05-25 LAB — CBC
HCT: 43.9 % (ref 39.0–52.0)
Hemoglobin: 15 g/dL (ref 13.0–17.0)
MCH: 29.9 pg (ref 26.0–34.0)
MCHC: 34.2 g/dL (ref 30.0–36.0)
MCV: 87.5 fL (ref 78.0–100.0)
PLATELETS: 281 10*3/uL (ref 150–400)
RBC: 5.02 MIL/uL (ref 4.22–5.81)
RDW: 14 % (ref 11.5–15.5)
WBC: 13 10*3/uL — AB (ref 4.0–10.5)

## 2014-05-25 LAB — BASIC METABOLIC PANEL
Anion gap: 7 (ref 5–15)
BUN: 12 mg/dL (ref 6–23)
CHLORIDE: 104 mmol/L (ref 96–112)
CO2: 26 mmol/L (ref 19–32)
Calcium: 9.6 mg/dL (ref 8.4–10.5)
Creatinine, Ser: 1.14 mg/dL (ref 0.50–1.35)
GFR, EST NON AFRICAN AMERICAN: 81 mL/min — AB (ref >90)
GLUCOSE: 99 mg/dL (ref 70–99)
POTASSIUM: 3.8 mmol/L (ref 3.5–5.1)
Sodium: 137 mmol/L (ref 135–145)

## 2014-05-25 MED ORDER — SODIUM CHLORIDE 0.9 % IV BOLUS (SEPSIS)
1000.0000 mL | Freq: Once | INTRAVENOUS | Status: AC
  Administered 2014-05-25: 1000 mL via INTRAVENOUS

## 2014-05-25 MED ORDER — DIPHENHYDRAMINE HCL 50 MG/ML IJ SOLN
25.0000 mg | Freq: Once | INTRAMUSCULAR | Status: AC
  Administered 2014-05-25: 25 mg via INTRAVENOUS
  Filled 2014-05-25: qty 1

## 2014-05-25 MED ORDER — METOCLOPRAMIDE HCL 5 MG/ML IJ SOLN
10.0000 mg | Freq: Once | INTRAMUSCULAR | Status: AC
  Administered 2014-05-25: 10 mg via INTRAVENOUS
  Filled 2014-05-25: qty 2

## 2014-05-25 NOTE — ED Notes (Signed)
MD at bedside. 

## 2014-05-25 NOTE — Discharge Instructions (Signed)
Return to the emergency room with worsening of symptoms, new symptoms or with symptoms that are concerning , especially severe worsening of headache, visual or speech changes, weakness in face, arms or legs. Ibuprofen 400mg  (2 tablets 200mg ) every 5-6 hours for 3-5 days. Follow up with PCP for recheck of GFR. Call to make appointment with [facility] for your headaches. Read below information and follow recommendations. General Headache Without Cause A headache is pain or discomfort felt around the head or neck area. The specific cause of a headache may not be found. There are many causes and types of headaches. A few common ones are:  Tension headaches.  Migraine headaches.  Cluster headaches.  Chronic daily headaches. HOME CARE INSTRUCTIONS   Keep all follow-up appointments with your caregiver or any specialist referral.  Only take over-the-counter or prescription medicines for pain or discomfort as directed by your caregiver.  Lie down in a dark, quiet room when you have a headache.  Keep a headache journal to find out what may trigger your migraine headaches. For example, write down:  What you eat and drink.  How much sleep you get.  Any change to your diet or medicines.  Try massage or other relaxation techniques.  Put ice packs or heat on the head and neck. Use these 3 to 4 times per day for 15 to 20 minutes each time, or as needed.  Limit stress.  Sit up straight, and do not tense your muscles.  Quit smoking if you smoke.  Limit alcohol use.  Decrease the amount of caffeine you drink, or stop drinking caffeine.  Eat and sleep on a regular schedule.  Get 7 to 9 hours of sleep, or as recommended by your caregiver.  Keep lights dim if bright lights bother you and make your headaches worse. SEEK MEDICAL CARE IF:   You have problems with the medicines you were prescribed.  Your medicines are not working.  You have a change from the usual  headache.  You have nausea or vomiting. SEEK IMMEDIATE MEDICAL CARE IF:   Your headache becomes severe.  You have a fever.  You have a stiff neck.  You have loss of vision.  You have muscular weakness or loss of muscle control.  You start losing your balance or have trouble walking.  You feel faint or pass out.  You have severe symptoms that are different from your first symptoms. MAKE SURE YOU:   Understand these instructions.  Will watch your condition.  Will get help right away if you are not doing well or get worse. Document Released: 01/27/2005 Document Revised: 04/21/2011 Document Reviewed: 02/12/2011 [facility] Patient Information 2015 [employer] [place]. This information is not intended to replace advice given to you by your health care provider. Make sure you discuss any questions you have with your health care provider.

## 2014-05-25 NOTE — ED Provider Notes (Signed)
CSN: [id number]     Arrival date & time 05/25/14  1525 History   First [id number] Initiated Contact with Patient 05/25/14 1558     Chief Complaint  Patient presents with  . Headache     (Consider location/radiation/quality/duration/timing/severity/associated sxs/prior Treatment) HPI  Devon Burch is a 39 y.o. male with PMH of headaches presenting with headache that started yesterday and developed gradually. Patient states is on the left side of his head and radiates into neck. He states it's like other headaches he's had for day location and does not have any nausea or vomiting but he does with typical migraines. He was given Toradol by his PMD reports some improvement. He denies any visual changes, slurred speech, numbness, tearing, focal weakness. No history of fevers or chills. Patient does have left-sided neck pain is worse with movement.   Past Medical History  Diagnosis Date  . Anxiety   . GERD (gastroesophageal reflux disease)    Past Surgical History  Procedure Laterality Date  . Hemorrhoid surgery    . Knee surgery     Family History  Problem Relation Age of Onset  . Emphysema Paternal Grandmother   . Heart disease Paternal Grandfather   . Heart disease Maternal Grandfather    History  Substance Use Topics  . Smoking status: Never Smoker   . Smokeless tobacco: Not on file  . Alcohol Use: Yes    Review of Systems 10 Systems reviewed and are negative for acute change except as noted in the HPI.    Allergies  Sulfa antibiotics and Penicillins  Home Medications   Prior to Admission medications   Medication Sig Start Date End Date Taking? Authorizing Provider  amitriptyline (ELAVIL) 50 MG tablet Take 75 mg by mouth at bedtime.    Historical Provider, MD  clonazePAM (KLONOPIN) 0.5 MG tablet Take 0.5 mg by mouth daily.    Historical Provider, MD  esomeprazole (NEXIUM) 10 MG packet Take 10 mg by mouth daily before breakfast.    Historical Provider, MD  Multiple  Vitamin (ONE-A-DAY MENS PO) Take 1 tablet by mouth daily.    Historical Provider, MD  oxyCODONE-acetaminophen (PERCOCET) 5-325 MG per tablet Take 2 tablets by mouth every 8 (eight) hours as needed for severe pain. 01/23/13   Wayland SalinasJohn Bednar, MD  pantoprazole (PROTONIX) 40 MG tablet Take 1 tablet (40 mg total) by mouth daily. Take 30-60 min before first meal of the day 05/25/12   Nyoka CowdenMichael B Wert, MD  ranitidine (ZANTAC) 150 MG tablet 2 at bedtime 05/25/12   Nyoka CowdenMichael B Wert, MD  sertraline (ZOLOFT) 25 MG tablet Take 25 mg by mouth daily.    Historical Provider, MD   BP 144/96 mmHg  Pulse 79  Temp(Src) 98 F (36.7 C) (Oral)  Resp 16  Ht 5\' 6"  (1.676 m)  Wt 168 lb (76.204 kg)  BMI 27.13 kg/m2  SpO2 100% Physical Exam  Constitutional: He appears well-developed and well-nourished. No distress.  HENT:  Head: Normocephalic and atraumatic.  Mouth/Throat: Oropharynx is clear and moist.  Eyes: Conjunctivae and EOM are normal. Pupils are equal, round, and reactive to light. Right eye exhibits no discharge. Left eye exhibits no discharge.  Neck: Normal range of motion. Neck supple.  No nuchal rigidity No midline neck pain. Patient with left-sided neck tenderness with muscle hypertrophyother skin changes.  Cardiovascular: Normal rate and regular rhythm.   Pulmonary/Chest: Effort normal and breath sounds normal. No respiratory distress. He has no wheezes.  Abdominal: Soft. Bowel sounds are  normal. He exhibits no distension. There is no tenderness.  Neurological: He is alert. No cranial nerve deficit. Coordination normal.  Speech is clear and goal oriented. Peripheral visual fields intact. Strength 5/5 in upper and lower extremities. Sensation intact. Intact rapid alternating movements, finger to nose, and heel to shin. Negative Romberg. No pronator drift. Normal gait.  Skin: Skin is warm and dry. He is not diaphoretic.  Nursing note and vitals reviewed.   ED Course  Procedures (including critical care  time) Labs Review Labs Reviewed  CBC - Abnormal; Notable for the following:    WBC 13.0 (*)    All other components within normal limits  BASIC METABOLIC PANEL - Abnormal; Notable for the following:    GFR calc non Af Amer 81 (*)    All other components within normal limits    Imaging Review No results found.   EKG Interpretation None      MDM   Final diagnoses:  Nonintractable headache   Patient with known intractable headache is like other headaches he's had before supper location as well as increased severity. He denies any neurological symptoms. VSS. Patient reports significant improvement of his headache in the ED. Neurological exam without any focal deficits. I doubt meningitis, no cranial hemorrhage, subarachnoid. Patient given referral to neurology for further workup. Patient also to follow-up with PCP. Pt concerned about his kidneys and basic lab work obtained and discussed with patient. Pt with decreased GFR. Pt to have rechecked with PCP in 1-2 weeks.  Discussed return precautions with patient. Discussed all results and patient verbalizes understanding and agrees with plan.  This is a shared patient. This patient was discussed with the physician who saw and evaluated the patient and agrees with the plan.   Oswaldo Conroy, PA-C 05/25/14 1902  Pricilla Loveless, MD 05/27/14 2005

## 2014-05-25 NOTE — ED Notes (Signed)
Headache since yesterday. Reports neck pain as well. Sent by PMD for eval. Given toradol in office. Sts "this isn't a migraine."

## 2014-05-29 ENCOUNTER — Emergency Department (HOSPITAL_BASED_OUTPATIENT_CLINIC_OR_DEPARTMENT_OTHER)
Admission: EM | Admit: 2014-05-29 | Discharge: 2014-05-29 | Disposition: A | Discharge status: Home / Self Care | Payer: 59 | Attending: Emergency Medicine | Admitting: Emergency Medicine

## 2014-05-29 ENCOUNTER — Encounter (HOSPITAL_BASED_OUTPATIENT_CLINIC_OR_DEPARTMENT_OTHER): Payer: Self-pay | Admitting: *Deleted

## 2014-05-29 DIAGNOSIS — Z880 Allergy status to penicillin: Secondary | ICD-10-CM | POA: Insufficient documentation

## 2014-05-29 DIAGNOSIS — K219 Gastro-esophageal reflux disease without esophagitis: Secondary | ICD-10-CM | POA: Diagnosis not present

## 2014-05-29 DIAGNOSIS — Z79899 Other long term (current) drug therapy: Secondary | ICD-10-CM | POA: Insufficient documentation

## 2014-05-29 DIAGNOSIS — J029 Acute pharyngitis, unspecified: Secondary | ICD-10-CM | POA: Diagnosis present

## 2014-05-29 DIAGNOSIS — F419 Anxiety disorder, unspecified: Secondary | ICD-10-CM | POA: Insufficient documentation

## 2014-05-29 DIAGNOSIS — M542 Cervicalgia: Secondary | ICD-10-CM | POA: Diagnosis not present

## 2014-05-29 DIAGNOSIS — B349 Viral infection, unspecified: Secondary | ICD-10-CM | POA: Diagnosis not present

## 2014-05-29 LAB — COMPREHENSIVE METABOLIC PANEL
ALBUMIN: 4.4 g/dL (ref 3.5–5.2)
ALT: 67 U/L — AB (ref 0–53)
AST: 41 U/L — AB (ref 0–37)
Alkaline Phosphatase: 94 U/L (ref 39–117)
Anion gap: 10 (ref 5–15)
BUN: 12 mg/dL (ref 6–23)
CALCIUM: 9.5 mg/dL (ref 8.4–10.5)
CO2: 27 mmol/L (ref 19–32)
Chloride: 102 mmol/L (ref 96–112)
Creatinine, Ser: 1.15 mg/dL (ref 0.50–1.35)
GFR calc Af Amer: >90 mL/min (ref >90)
GFR, EST NON AFRICAN AMERICAN: 80 mL/min — AB (ref >90)
Glucose, Bld: 117 mg/dL — ABNORMAL HIGH (ref 70–99)
Potassium: 3.9 mmol/L (ref 3.5–5.1)
Sodium: 139 mmol/L (ref 135–145)
Total Bilirubin: 0.3 mg/dL (ref 0.3–1.2)
Total Protein: 7.3 g/dL (ref 6.0–8.3)

## 2014-05-29 LAB — CBC WITH DIFFERENTIAL/PLATELET
BASOS PCT: 1 % (ref 0–1)
Basophils Absolute: 0.1 10*3/uL (ref 0.0–0.1)
Eosinophils Absolute: 0.4 10*3/uL (ref 0.0–0.7)
Eosinophils Relative: 5 % (ref 0–5)
HCT: 41.7 % (ref 39.0–52.0)
HEMOGLOBIN: 14.3 g/dL (ref 13.0–17.0)
Lymphocytes Relative: 19 % (ref 12–46)
Lymphs Abs: 1.7 10*3/uL (ref 0.7–4.0)
MCH: 30.1 pg (ref 26.0–34.0)
MCHC: 34.3 g/dL (ref 30.0–36.0)
MCV: 87.8 fL (ref 78.0–100.0)
Monocytes Absolute: 1.1 10*3/uL — ABNORMAL HIGH (ref 0.1–1.0)
Monocytes Relative: 12 % (ref 3–12)
NEUTROS ABS: 5.7 10*3/uL (ref 1.7–7.7)
NEUTROS PCT: 63 % (ref 43–77)
Platelets: 262 10*3/uL (ref 150–400)
RBC: 4.75 MIL/uL (ref 4.22–5.81)
RDW: 13.8 % (ref 11.5–15.5)
WBC: 9 10*3/uL (ref 4.0–10.5)

## 2014-05-29 LAB — MONONUCLEOSIS SCREEN: Mono Screen: NEGATIVE

## 2014-05-29 MED ORDER — OXYCODONE-ACETAMINOPHEN 5-325 MG PO TABS: 2.0000 | ORAL_TABLET | Freq: Three times a day (TID) | ORAL | Status: DC | PRN

## 2014-05-29 NOTE — ED Notes (Signed)
MD at bedside. 

## 2014-05-29 NOTE — Discharge Instructions (Signed)

## 2014-05-29 NOTE — ED Notes (Signed)
Pt c/o sore throat x 1 week seen here sat for  same

## 2014-05-29 NOTE — ED Provider Notes (Signed)
CSN: [id number]     Arrival date & time 05/29/14  1728 History  This chart was scribed for Nelva Nay, MD by Gwenyth Ober, ED Scribe. This patient was seen in room MHT13/[facility]13 and the patient's care was started at 5:53 PM.    Chief Complaint  Patient presents with  . Sore Throat   The history is provided by the patient. No language interpreter was used.   HPI Comments: Devon Burch is a 39 y.o. male who presents to the Emergency Department complaining of constant, moderate sore throat, worse on his left side, that started 5 days ago. He states left-sided neck pain as an associated symptom. His pain becomes worse with chewing his food and swallowing. Pt was seen in the ED on 4/14 for a HA associated with current symptoms. He denies fever.  Past Medical History  Diagnosis Date  . Anxiety   . GERD (gastroesophageal reflux disease)    Past Surgical History  Procedure Laterality Date  . Hemorrhoid surgery    . Knee surgery     Family History  Problem Relation Age of Onset  . Emphysema Paternal Grandmother   . Heart disease Paternal Grandfather   . Heart disease Maternal Grandfather    History  Substance Use Topics  . Smoking status: Never Smoker   . Smokeless tobacco: Not on file  . Alcohol Use: Yes    Review of Systems  Constitutional: Negative for fever.  HENT: Positive for sore throat.   Musculoskeletal: Positive for neck pain.  All other systems reviewed and are negative.   Allergies  Sulfa antibiotics and Penicillins  Home Medications   Prior to Admission medications   Medication Sig Start Date End Date Taking? Authorizing Provider  amitriptyline (ELAVIL) 50 MG tablet Take 75 mg by mouth at bedtime.    Historical Provider, MD  clonazePAM (KLONOPIN) 0.5 MG tablet Take 0.5 mg by mouth daily.    Historical Provider, MD  esomeprazole (NEXIUM) 10 MG packet Take 10 mg by mouth daily before breakfast.    Historical Provider, MD  Multiple Vitamin (ONE-A-DAY MENS  PO) Take 1 tablet by mouth daily.    Historical Provider, MD  oxyCODONE-acetaminophen (PERCOCET) 5-325 MG per tablet Take 2 tablets by mouth every 8 (eight) hours as needed for severe pain. 05/29/14   Nelva Nay, MD  pantoprazole (PROTONIX) 40 MG tablet Take 1 tablet (40 mg total) by mouth daily. Take 30-60 min before first meal of the day 05/25/12   Nyoka Cowden, MD  ranitidine (ZANTAC) 150 MG tablet 2 at bedtime 05/25/12   Nyoka Cowden, MD  sertraline (ZOLOFT) 25 MG tablet Take 25 mg by mouth daily.    Historical Provider, MD   BP 136/88 mmHg  Pulse 81  Temp(Src) 98.6 F (37 C) (Oral)  Resp 16  Wt 168 lb (76.204 kg)  SpO2 98% Physical Exam Physical Exam  Nursing note and vitals reviewed. Constitutional: She is oriented to person, place, and time. She appears well-developed and well-nourished. No distress.  HENT:  Head: Normocephalic and atraumatic.  Eyes: Pupils are equal, round, and reactive to light.  Neck: Normal range of motion.  no meningeal findings. Cardiovascular: Normal rate and intact distal pulses.   Pulmonary/Chest: No respiratory distress.  Abdominal: Normal appearance. She exhibits no distension.  Soft nontender no rebound or guarding tenderness  Musculoskeletal: Normal range of motion.  Neurological: She is alert and oriented to person, place, and time. No cranial nerve deficit.  Skin: Skin is  warm and dry. No rash noted.  Psychiatric: She has a normal mood and affect. Her behavior is normal.   ED Course  Procedures   DIAGNOSTIC STUDIES: Oxygen Saturation is 100% on RA, normal by my interpretation.    COORDINATION OF CARE: 6:02 PM Discussed treatment plan with pt at bedside and pt agreed to plan.   Labs Review Labs Reviewed  CBC WITH DIFFERENTIAL/PLATELET - Abnormal; Notable for the following:    Monocytes Absolute 1.1 (*)    All other components within normal limits  COMPREHENSIVE METABOLIC PANEL - Abnormal; Notable for the following:    Glucose, Bld  117 (*)    AST 41 (*)    ALT 67 (*)    GFR calc non Af Amer 80 (*)    All other components within normal limits  MONONUCLEOSIS SCREEN  RPR  HIV ANTIBODY (ROUTINE TESTING)    Imaging Review No results found.    MDM   Final diagnoses:  Viral illness    I personally performed the services described in this documentation, which was scribed in my presence. The recorded information has been reviewed and considered.    Nelva Nayobert Kumari Sculley, MD 06/03/14 1051

## 2014-05-31 LAB — RPR: RPR Ser Ql: NONREACTIVE

## 2014-05-31 LAB — HIV ANTIBODY (ROUTINE TESTING W REFLEX): HIV SCREEN 4TH GENERATION: NONREACTIVE

## 2014-07-20 DIAGNOSIS — J209 Acute bronchitis, unspecified: Secondary | ICD-10-CM | POA: Insufficient documentation

## 2015-10-08 ENCOUNTER — Emergency Department (HOSPITAL_BASED_OUTPATIENT_CLINIC_OR_DEPARTMENT_OTHER)
Admission: EM | Admit: 2015-10-08 | Discharge: 2015-10-08 | Disposition: A | Discharge status: Home / Self Care | Payer: Self-pay | Attending: Emergency Medicine | Admitting: Emergency Medicine

## 2015-10-08 ENCOUNTER — Emergency Department (HOSPITAL_BASED_OUTPATIENT_CLINIC_OR_DEPARTMENT_OTHER): Payer: Self-pay

## 2015-10-08 ENCOUNTER — Encounter (HOSPITAL_BASED_OUTPATIENT_CLINIC_OR_DEPARTMENT_OTHER): Payer: Self-pay | Admitting: *Deleted

## 2015-10-08 DIAGNOSIS — Z79899 Other long term (current) drug therapy: Secondary | ICD-10-CM | POA: Insufficient documentation

## 2015-10-08 DIAGNOSIS — R197 Diarrhea, unspecified: Secondary | ICD-10-CM | POA: Insufficient documentation

## 2015-10-08 DIAGNOSIS — R1030 Lower abdominal pain, unspecified: Secondary | ICD-10-CM | POA: Insufficient documentation

## 2015-10-08 LAB — CBC WITH DIFFERENTIAL/PLATELET
BASOS ABS: 0 10*3/uL (ref 0.0–0.1)
Basophils Relative: 0 %
EOS ABS: 0.1 10*3/uL (ref 0.0–0.7)
EOS PCT: 1 %
HCT: 44.4 % (ref 39.0–52.0)
HEMOGLOBIN: 15.5 g/dL (ref 13.0–17.0)
Lymphocytes Relative: 20 %
Lymphs Abs: 2.1 10*3/uL (ref 0.7–4.0)
MCH: 30 pg (ref 26.0–34.0)
MCHC: 34.9 g/dL (ref 30.0–36.0)
MCV: 85.9 fL (ref 78.0–100.0)
Monocytes Absolute: 1 10*3/uL (ref 0.1–1.0)
Monocytes Relative: 10 %
NEUTROS PCT: 69 %
Neutro Abs: 7 10*3/uL (ref 1.7–7.7)
PLATELETS: 287 10*3/uL (ref 150–400)
RBC: 5.17 MIL/uL (ref 4.22–5.81)
RDW: 13.4 % (ref 11.5–15.5)
WBC: 10.2 10*3/uL (ref 4.0–10.5)

## 2015-10-08 LAB — URINALYSIS, ROUTINE W REFLEX MICROSCOPIC
Bilirubin Urine: NEGATIVE
Glucose, UA: NEGATIVE mg/dL
Hgb urine dipstick: NEGATIVE
KETONES UR: NEGATIVE mg/dL
LEUKOCYTES UA: NEGATIVE
NITRITE: NEGATIVE
PROTEIN: NEGATIVE mg/dL
Specific Gravity, Urine: 1.009 (ref 1.005–1.030)
pH: 8 (ref 5.0–8.0)

## 2015-10-08 LAB — COMPREHENSIVE METABOLIC PANEL
ALT: 65 U/L — ABNORMAL HIGH (ref 17–63)
AST: 33 U/L (ref 15–41)
Albumin: 5.2 g/dL — ABNORMAL HIGH (ref 3.5–5.0)
Alkaline Phosphatase: 91 U/L (ref 38–126)
Anion gap: 8 (ref 5–15)
BILIRUBIN TOTAL: 0.8 mg/dL (ref 0.3–1.2)
BUN: 12 mg/dL (ref 6–20)
CO2: 28 mmol/L (ref 22–32)
Calcium: 10.3 mg/dL (ref 8.9–10.3)
Chloride: 101 mmol/L (ref 101–111)
Creatinine, Ser: 0.93 mg/dL (ref 0.61–1.24)
GFR calc non Af Amer: >60 mL/min (ref >60)
Glucose, Bld: 102 mg/dL — ABNORMAL HIGH (ref 65–99)
POTASSIUM: 4.6 mmol/L (ref 3.5–5.1)
Sodium: 137 mmol/L (ref 135–145)
TOTAL PROTEIN: 8.3 g/dL — AB (ref 6.5–8.1)

## 2015-10-08 LAB — LIPASE, BLOOD: LIPASE: 20 U/L (ref 11–51)

## 2015-10-08 MED ORDER — IOPAMIDOL (ISOVUE-300) INJECTION 61%
100.0000 mL | Freq: Once | INTRAVENOUS | Status: AC | PRN
  Administered 2015-10-08: 100 mL via INTRAVENOUS

## 2015-10-08 MED ORDER — SODIUM CHLORIDE 0.9 % IV BOLUS (SEPSIS)
1000.0000 mL | Freq: Once | INTRAVENOUS | Status: AC
  Administered 2015-10-08: 1000 mL via INTRAVENOUS

## 2015-10-08 NOTE — ED Provider Notes (Signed)
[facility] Provider Note   CSN: [id number] Arrival date & time: 10/08/15  1500  By signing my name below, I, Aggie MoatsJenny Song, attest that this documentation has been prepared under the direction and in the presence of Rolan BuccoMelanie Alexee Delsanto, MD. Electronically Signed: Aggie MoatsJenny Song, ED Scribe. 10/08/15. 4:04 PM.  History   Chief Complaint Chief Complaint  Patient presents with  . Abdominal Pain   The history is provided by the patient. No language interpreter was used.   HPI Comments:  Devon Burch is a 40 y.o. male who presents to the Emergency Department complaining of constant, moderate abdominal pain, which started a week ago. Pain is described as a burning sensation and is localized bilaterally in lower abdomen, but worse on left side. Pain also radiates to back. Associated symptoms include nausea, decreased appetite secondary to pain, diarrhea, pain in testicles and dyspnea. No modifying factors reported. Denies vomiting, fever and dysuria. Pt reports that he saw his PCP Thursday and blood work and urinalysis were normal.   Past Medical History:  Diagnosis Date  . Anxiety   . GERD (gastroesophageal reflux disease)     Patient Active Problem List   Diagnosis Date Noted  . Cough 05/28/2012  . Hemoptysis 05/28/2012    Past Surgical History:  Procedure Laterality Date  . HEMORRHOID SURGERY    . KNEE SURGERY         Home Medications    Prior to Admission medications   Medication Sig Start Date End Date Taking? Authorizing Provider  ALPRAZolam (XANAX PO) Take by mouth.   Yes Historical Provider, MD  amitriptyline (ELAVIL) 50 MG tablet Take 75 mg by mouth at bedtime.   Yes Historical Provider, MD  clonazePAM (KLONOPIN) 0.5 MG tablet Take 0.5 mg by mouth daily.   Yes Historical Provider, MD  pantoprazole (PROTONIX) 40 MG tablet Take 1 tablet (40 mg total) by mouth daily. Take 30-60 min before first meal of the day 05/25/12  Yes Nyoka CowdenMichael B Wert, MD  ranitidine (ZANTAC)  150 MG tablet 2 at bedtime 05/25/12  Yes Nyoka CowdenMichael B Wert, MD  esomeprazole (NEXIUM) 10 MG packet Take 10 mg by mouth daily before breakfast.    Historical Provider, MD  Multiple Vitamin (ONE-A-DAY MENS PO) Take 1 tablet by mouth daily.    Historical Provider, MD  oxyCODONE-acetaminophen (PERCOCET) 5-325 MG per tablet Take 2 tablets by mouth every 8 (eight) hours as needed for severe pain. 05/29/14   Nelva Nayobert Beaton, MD  sertraline (ZOLOFT) 25 MG tablet Take 25 mg by mouth daily.    Historical Provider, MD    Family History Family History  Problem Relation Age of Onset  . Emphysema Paternal Grandmother   . Heart disease Paternal Grandfather   . Heart disease Maternal Grandfather     Social History Social History  Substance Use Topics  . Smoking status: Never Smoker  . Smokeless tobacco: Not on file  . Alcohol use Yes     Allergies   Sulfa antibiotics and Penicillins   Review of Systems Review of Systems  Constitutional: Positive for appetite change. Negative for chills, diaphoresis, fatigue and fever.  HENT: Negative for congestion, rhinorrhea and sneezing.   Eyes: Negative.   Respiratory: Negative for cough, chest tightness and shortness of breath.   Cardiovascular: Negative for chest pain and leg swelling.  Gastrointestinal: Positive for abdominal pain, diarrhea and nausea. Negative for blood in stool and vomiting.  Genitourinary: Negative for difficulty urinating, flank pain, frequency and hematuria.  Musculoskeletal:  Negative for arthralgias and back pain.  Skin: Negative for rash.  Neurological: Negative for dizziness, speech difficulty, weakness, numbness and headaches.     Physical Exam Updated Vital Signs BP 131/95 (BP Location: Left Arm)   Pulse 72   Temp 98.1 F (36.7 C) (Oral)   Resp 17   Ht 5\' 6"  (1.676 m)   Wt 170 lb (77.1 kg)   SpO2 100%   BMI 27.44 kg/m   Physical Exam  Constitutional: He is oriented to person, place, and time. He appears  well-developed and well-nourished.  HENT:  Head: Normocephalic and atraumatic.  Eyes: Pupils are equal, round, and reactive to light.  Neck: Normal range of motion. Neck supple.  Cardiovascular: Normal rate, regular rhythm and normal heart sounds.   Pulmonary/Chest: Effort normal and breath sounds normal. No respiratory distress. He has no wheezes. He has no rales. He exhibits no tenderness.  Abdominal: Soft. Bowel sounds are normal. There is tenderness. There is no rebound and no guarding.  Tenderness to left lower abdomen. Mild tenderness to right lower abdomen. No pain to inguinal region or testicles.  Musculoskeletal: Normal range of motion. He exhibits no edema.  Lymphadenopathy:    He has no cervical adenopathy.  Neurological: He is alert and oriented to person, place, and time.  Skin: Skin is warm and dry. No rash noted.  Psychiatric: He has a normal mood and affect.    ED Treatments / Results  DIAGNOSTIC STUDIES:  Oxygen Saturation is 98% on room air, normal by my interpretation.    COORDINATION OF CARE:  4:01 PM Discussed treatment plan with pt at bedside, which includes CT scan with contrast, IV fluids, blood work, and pt agreed to plan.  Labs (all labs ordered are listed, but only abnormal results are displayed) Labs Reviewed  COMPREHENSIVE METABOLIC PANEL - Abnormal; Notable for the following:       Result Value   Glucose, Bld 102 (*)    Total Protein 8.3 (*)    Albumin 5.2 (*)    ALT 65 (*)    All other components within normal limits  CBC WITH DIFFERENTIAL/PLATELET  LIPASE, BLOOD  URINALYSIS, ROUTINE W REFLEX MICROSCOPIC (NOT AT [facility])    EKG  EKG Interpretation None       Radiology Ct Abdomen Pelvis W Contrast  Result Date: 10/08/2015 CLINICAL DATA:  Lower abdominal pain worse on LEFT side for 1 week, history GERD, prior hemorrhoid surgery EXAM: CT ABDOMEN AND PELVIS WITH CONTRAST TECHNIQUE: Multidetector CT imaging of the abdomen and pelvis was  performed using the standard protocol following bolus administration of intravenous contrast. Sagittal and coronal MPR images reconstructed from axial data set. CONTRAST:  ISOVUE-300 IOPAMIDOL (ISOVUE-300) INJECTION 61% IV. Dilute oral contrast. COMPARISON:  1October 10, 2015 FINDINGS: Lower chest:  Lung bases clear Hepatobiliary: Liver and gallbladder normal appearance Pancreas: Normal appearance Spleen: Poorly defined low-attenuation lesion lateral spleen 9 mm diameter image 23 previously 8 mm. Adrenals/Urinary Tract: Adrenal glands normal appearance. Kidneys, ureters and bladder normal appearance. Prostate gland unremarkable. Stomach/Bowel: Normal appendix. Stomach and bowel loops normal appearance. Vascular/Lymphatic: Vascular structures unremarkable. No adenopathy. Reproductive: N/A Other: No free air or free fluid. Tiny umbilical hernia containing fat. Small BILATERAL inguinal hernias containing fat. Musculoskeletal: Unremarkable IMPRESSION: No acute intra-abdominal or intrapelvic abnormalities. Tiny umbilical and small BILATERAL inguinal hernias containing fat. Electronically Signed   By: Ulyses Southward M.D.   On: 10/08/2015 17:35    Procedures Procedures (including critical care time)  Medications Ordered in  ED Medications  sodium chloride 0.9 % bolus 1,000 mL (1,000 mLs Intravenous New Bag/Given 10/08/15 1642)  iopamidol (ISOVUE-300) 61 % injection 100 mL (100 mLs Intravenous Contrast Given 10/08/15 1722)     Initial Impression / Assessment and Plan / ED Course  I have reviewed the triage vital signs and the nursing notes.  Pertinent labs & imaging results that were available during my care of the patient were reviewed by me and considered in my medical decision making (see chart for details).  Clinical Course    Patient CT scan is unremarkable. There are small bilateral and one hernias containing only fat. Patient has no tenderness around these areas. His labs are unremarkable. He had some  mild elevation of his LFT which is consistent with his prior lab work. I advised him to have his PCP follow this. He was advised to use a clear liquid diet for the next several days and slowly progress after that. Return precautions were given.  Final Clinical Impressions(s) / ED Diagnoses   Final diagnoses:  Lower abdominal pain    New Prescriptions New Prescriptions   No medications on file  I personally performed the services described in this documentation, which was scribed in my presence.  The recorded information has been reviewed and considered.     Rolan Bucco, MD 10/08/15 [phone]

## 2015-10-08 NOTE — ED Notes (Signed)
MD at bedside. 

## 2015-10-08 NOTE — ED Triage Notes (Signed)
Abdominal pain for a week. He has a CT of his abdomen scheduled for today but states he feels to bad go for the test.

## 2016-01-02 DIAGNOSIS — J0120 Acute ethmoidal sinusitis, unspecified: Secondary | ICD-10-CM | POA: Insufficient documentation

## 2016-03-18 DIAGNOSIS — F32A Depression, unspecified: Secondary | ICD-10-CM | POA: Insufficient documentation

## 2016-03-18 DIAGNOSIS — F419 Anxiety disorder, unspecified: Secondary | ICD-10-CM | POA: Insufficient documentation

## 2016-05-26 DIAGNOSIS — K219 Gastro-esophageal reflux disease without esophagitis: Secondary | ICD-10-CM | POA: Insufficient documentation

## 2018-03-17 DIAGNOSIS — M79671 Pain in right foot: Secondary | ICD-10-CM | POA: Insufficient documentation

## 2018-03-17 DIAGNOSIS — M25572 Pain in left ankle and joints of left foot: Secondary | ICD-10-CM | POA: Insufficient documentation

## 2018-09-14 DIAGNOSIS — M1711 Unilateral primary osteoarthritis, right knee: Secondary | ICD-10-CM | POA: Insufficient documentation

## 2019-03-18 DIAGNOSIS — S6990XA Unspecified injury of unspecified wrist, hand and finger(s), initial encounter: Secondary | ICD-10-CM | POA: Insufficient documentation

## 2019-04-01 DIAGNOSIS — M25539 Pain in unspecified wrist: Secondary | ICD-10-CM | POA: Insufficient documentation

## 2019-04-15 DIAGNOSIS — S5782XA Crushing injury of left forearm, initial encounter: Secondary | ICD-10-CM | POA: Insufficient documentation

## 2019-05-09 DIAGNOSIS — G5630 Lesion of radial nerve, unspecified upper limb: Secondary | ICD-10-CM | POA: Insufficient documentation

## 2019-05-09 DIAGNOSIS — R29898 Other symptoms and signs involving the musculoskeletal system: Secondary | ICD-10-CM | POA: Insufficient documentation

## 2019-06-21 DIAGNOSIS — S83221A Peripheral tear of medial meniscus, current injury, right knee, initial encounter: Secondary | ICD-10-CM | POA: Insufficient documentation

## 2020-08-30 DIAGNOSIS — F4322 Adjustment disorder with anxiety: Secondary | ICD-10-CM | POA: Insufficient documentation

## 2020-08-30 DIAGNOSIS — M25569 Pain in unspecified knee: Secondary | ICD-10-CM | POA: Insufficient documentation

## 2020-08-30 DIAGNOSIS — R1310 Dysphagia, unspecified: Secondary | ICD-10-CM | POA: Insufficient documentation

## 2020-08-30 DIAGNOSIS — F17200 Nicotine dependence, unspecified, uncomplicated: Secondary | ICD-10-CM | POA: Insufficient documentation

## 2020-08-30 DIAGNOSIS — Z860100 Personal history of colon polyps, unspecified: Secondary | ICD-10-CM | POA: Insufficient documentation

## 2020-08-30 DIAGNOSIS — R0689 Other abnormalities of breathing: Secondary | ICD-10-CM | POA: Insufficient documentation

## 2020-08-30 DIAGNOSIS — G4709 Other insomnia: Secondary | ICD-10-CM | POA: Insufficient documentation

## 2020-08-30 DIAGNOSIS — R309 Painful micturition, unspecified: Secondary | ICD-10-CM | POA: Insufficient documentation

## 2020-08-30 DIAGNOSIS — M2391 Unspecified internal derangement of right knee: Secondary | ICD-10-CM | POA: Insufficient documentation

## 2020-08-30 DIAGNOSIS — R21 Rash and other nonspecific skin eruption: Secondary | ICD-10-CM | POA: Insufficient documentation

## 2020-08-30 DIAGNOSIS — R002 Palpitations: Secondary | ICD-10-CM | POA: Insufficient documentation

## 2020-08-30 DIAGNOSIS — S2341XA Sprain of ribs, initial encounter: Secondary | ICD-10-CM | POA: Insufficient documentation

## 2020-08-30 DIAGNOSIS — E559 Vitamin D deficiency, unspecified: Secondary | ICD-10-CM | POA: Insufficient documentation

## 2020-08-30 DIAGNOSIS — G43909 Migraine, unspecified, not intractable, without status migrainosus: Secondary | ICD-10-CM | POA: Insufficient documentation

## 2020-08-30 DIAGNOSIS — R109 Unspecified abdominal pain: Secondary | ICD-10-CM | POA: Insufficient documentation

## 2020-08-30 DIAGNOSIS — R071 Chest pain on breathing: Secondary | ICD-10-CM | POA: Insufficient documentation

## 2020-08-30 DIAGNOSIS — M109 Gout, unspecified: Secondary | ICD-10-CM | POA: Insufficient documentation

## 2020-08-30 DIAGNOSIS — L700 Acne vulgaris: Secondary | ICD-10-CM | POA: Insufficient documentation

## 2020-08-30 DIAGNOSIS — G4730 Sleep apnea, unspecified: Secondary | ICD-10-CM | POA: Insufficient documentation

## 2020-08-30 DIAGNOSIS — R319 Hematuria, unspecified: Secondary | ICD-10-CM | POA: Insufficient documentation

## 2020-08-30 DIAGNOSIS — M23329 Other meniscus derangements, posterior horn of medial meniscus, unspecified knee: Secondary | ICD-10-CM | POA: Insufficient documentation

## 2020-08-30 DIAGNOSIS — J039 Acute tonsillitis, unspecified: Secondary | ICD-10-CM | POA: Insufficient documentation

## 2020-08-30 DIAGNOSIS — E7849 Other hyperlipidemia: Secondary | ICD-10-CM | POA: Insufficient documentation

## 2020-08-30 DIAGNOSIS — L739 Follicular disorder, unspecified: Secondary | ICD-10-CM | POA: Insufficient documentation

## 2020-08-30 DIAGNOSIS — Z720 Tobacco use: Secondary | ICD-10-CM | POA: Insufficient documentation

## 2020-08-30 DIAGNOSIS — R1013 Epigastric pain: Secondary | ICD-10-CM | POA: Insufficient documentation

## 2020-09-07 ENCOUNTER — Encounter (HOSPITAL_BASED_OUTPATIENT_CLINIC_OR_DEPARTMENT_OTHER): Payer: Self-pay | Admitting: *Deleted

## 2020-09-07 ENCOUNTER — Emergency Department (HOSPITAL_BASED_OUTPATIENT_CLINIC_OR_DEPARTMENT_OTHER)
Admission: EM | Admit: 2020-09-07 | Discharge: 2020-09-07 | Disposition: A | Discharge status: Home / Self Care | Payer: 59 | Attending: Emergency Medicine | Admitting: Emergency Medicine

## 2020-09-07 ENCOUNTER — Other Ambulatory Visit: Payer: Self-pay

## 2020-09-07 DIAGNOSIS — K219 Gastro-esophageal reflux disease without esophagitis: Secondary | ICD-10-CM | POA: Insufficient documentation

## 2020-09-07 DIAGNOSIS — U071 COVID-19: Secondary | ICD-10-CM | POA: Insufficient documentation

## 2020-09-07 DIAGNOSIS — M549 Dorsalgia, unspecified: Secondary | ICD-10-CM | POA: Insufficient documentation

## 2020-09-07 DIAGNOSIS — R109 Unspecified abdominal pain: Secondary | ICD-10-CM | POA: Insufficient documentation

## 2020-09-07 LAB — URINALYSIS, ROUTINE W REFLEX MICROSCOPIC
Bilirubin Urine: NEGATIVE
Glucose, UA: NEGATIVE mg/dL
Hgb urine dipstick: NEGATIVE
Ketones, ur: NEGATIVE mg/dL
Leukocytes,Ua: NEGATIVE
Nitrite: NEGATIVE
Protein, ur: NEGATIVE mg/dL
Specific Gravity, Urine: 1.01 (ref 1.005–1.030)
pH: 7.5 (ref 5.0–8.0)

## 2020-09-07 MED ORDER — METHOCARBAMOL 500 MG PO TABS: 500.0000 mg | ORAL_TABLET | Freq: Four times a day (QID) | ORAL | 0 refills | Status: DC | PRN

## 2020-09-07 MED ORDER — TRAMADOL HCL 50 MG PO TABS
50.0000 mg | ORAL_TABLET | Freq: Four times a day (QID) | ORAL | 0 refills | Status: DC | PRN
Start: 2020-09-07 — End: 2021-05-08

## 2020-09-07 NOTE — ED Triage Notes (Signed)
C/o back pain x 5 days , dx covid last week

## 2020-09-07 NOTE — Discharge Instructions (Addendum)
You have chosen to not get a CT scan today.  Without a CT scan, I cannot be sure there is not something serious causing your pain.  Apply ice for 30 minutes at a time, 4 times a day.  Take ibuprofen and/or acetaminophen as needed for pain.  At any point, you are welcome to come back for reevaluation, which may include a CT scan.

## 2020-09-07 NOTE — ED Provider Notes (Signed)
[facility] EMERGENCY DEPARTMENT Provider Note   CSN: [id number] Arrival date & time: 09/07/20  0040     History Chief Complaint  Patient presents with   Back Pain    Devon Burch is a 45 y.o. male.  The history is provided by the patient.  Back Pain He has history of GERD and was recently diagnosed with COVID-19.  He comes in complaining of pain in his left flank for the last 5 days.  Pain has radiated to the left side of the abdomen.  Pain is worse with movement and twisting.  He has taken ibuprofen and acetaminophen without relief.  He rates pain at 8/10.  He denies any recent trauma.  He is not running fevers or having chills or sweats.  He denies any urinary difficulty.   Past Medical History:  Diagnosis Date   Anxiety    GERD (gastroesophageal reflux disease)     Patient Active Problem List   Diagnosis Date Noted   Cough 05/28/2012   Hemoptysis 05/28/2012    Past Surgical History:  Procedure Laterality Date   HEMORRHOID SURGERY     KNEE SURGERY         Family History  Problem Relation Age of Onset   Emphysema Paternal Grandmother    Heart disease Paternal Grandfather    Heart disease Maternal Grandfather     Social History   Tobacco Use   Smoking status: Never  Substance Use Topics   Alcohol use: Yes   Drug use: No    Home Medications Prior to Admission medications   Medication Sig Start Date End Date Taking? Authorizing Provider  ALPRAZolam (XANAX PO) Take by mouth.    [provider]  amitriptyline (ELAVIL) 50 MG tablet Take 75 mg by mouth at bedtime.    [provider]  clonazePAM (KLONOPIN) 0.5 MG tablet Take 0.5 mg by mouth daily.    [provider]  esomeprazole (NEXIUM) 10 MG packet Take 10 mg by mouth daily before breakfast.    [provider]  Multiple Vitamin (ONE-A-DAY MENS PO) Take 1 tablet by mouth daily.    [provider]  oxyCODONE-acetaminophen (PERCOCET) 5-325 MG per  tablet Take 2 tablets by mouth every 8 (eight) hours as needed for severe pain. 05/29/14   Nelva Nay, MD  pantoprazole (PROTONIX) 40 MG tablet Take 1 tablet (40 mg total) by mouth daily. Take 30-60 min before first meal of the day 05/25/12   Nyoka Cowden, MD  ranitidine (ZANTAC) 150 MG tablet 2 at bedtime 05/25/12   Nyoka Cowden, MD  sertraline (ZOLOFT) 25 MG tablet Take 25 mg by mouth daily.    [provider]    Allergies    Sulfa antibiotics and Penicillins  Review of Systems   Review of Systems  Musculoskeletal:  Positive for back pain.  All other systems reviewed and are negative.  Physical Exam Updated Vital Signs BP (!) 147/96   Pulse 71   Temp 97.8 F (36.6 C) (Oral)   Resp 18   Ht 5\' 6"  (1.676 m)   Wt 72.6 kg   SpO2 99%   BMI 25.82 kg/m   Physical Exam Vitals and nursing note reviewed.  45 year old male, resting comfortably and in no acute distress. Vital signs are significant for elevated blood pressure. Oxygen saturation is 99%, which is normal. Head is normocephalic and atraumatic. PERRLA, EOMI. Oropharynx is clear. Neck is nontender and supple without adenopathy or JVD. Back  is nontender in the midline.  There is mild left CVA tenderness.  There is no palpable paralumbar spasm. Lungs are clear without rales, wheezes, or rhonchi. Chest is nontender. Heart has regular rate and rhythm without murmur. Abdomen is soft, flat, with mild tenderness in the left upper abdomen, moderate tenderness in the left lower abdomen.  There is no rebound or guarding.  There are no masses or hepatosplenomegaly and peristalsis is normoactive. Extremities have no cyanosis or edema, full range of motion is present. Skin is warm and dry without rash. Neurologic: Mental status is normal, cranial nerves are intact, there are no motor or sensory deficits.  ED Results / Procedures / Treatments   Labs (all labs ordered are listed, but only abnormal results are  displayed) Labs Reviewed  URINALYSIS, ROUTINE W REFLEX MICROSCOPIC   Procedures Procedures   Medications Ordered in ED Medications - No data to display  ED Course  I have reviewed the triage vital signs and the nursing notes.  Pertinent labs & imaging results that were available during my care of the patient were reviewed by me and considered in my medical decision making (see chart for details).   MDM Rules/Calculators/A&P                         Left flank pain with radiation to the left side of the abdomen.  His description of pain sounds musculoskeletal, but pattern and pain is concerning for possible renal colic.  Old records are reviewed, and he has had CT scans of his abdomen and pelvis which have not shown evidence of renal calculi, most recent abdominal CT scan was November 2020.  Patient states that he has had significant number of CT scans and wants to avoid one if possible.  We will check urinalysis, but likely will still need renal stone protocol CT scan.  Urinalysis is normal.  I have recommended to the patient to get a renal stone protocol CT scan to rule out any serious pathology.  He is still very concerned about his cumulative radiation exposure.  After shared decision-making which included discussion of the risks and benefits of having a CT scan done versus not having one done, he has decided to proceed without CT scan.  He does understand that serious pathology could be missed.  However, given the length of time he has had symptoms on the way pain is worsened with movement, it is felt most likely that this is indeed musculoskeletal.  He is advised to apply ice, use over-the-counter analgesics as needed for pain.  Given prescriptions for methocarbamol and a small number of tramadol tablets.  He is referred to sports medicine for follow-up, also advised to return if symptoms worsen to get CT scan at that point.  Final Clinical Impression(s) / ED Diagnoses Final diagnoses:   Left flank pain    Rx / DC Orders ED Discharge Orders          Ordered    traMADol (ULTRAM) 50 MG tablet  Every 6 hours PRN        09/07/20 0224    methocarbamol (ROBAXIN) 500 MG tablet  Every 6 hours PRN        09/07/20 0224             Dione Booze, MD 09/07/20 [phone]

## 2020-09-10 ENCOUNTER — Other Ambulatory Visit: Payer: Self-pay

## 2020-09-10 ENCOUNTER — Ambulatory Visit (INDEPENDENT_AMBULATORY_CARE_PROVIDER_SITE_OTHER): Payer: 59 | Admitting: Family Medicine

## 2020-09-10 ENCOUNTER — Ambulatory Visit (HOSPITAL_BASED_OUTPATIENT_CLINIC_OR_DEPARTMENT_OTHER)
Admission: RE | Admit: 2020-09-10 | Discharge: 2020-09-10 | Disposition: A | Discharge status: Home / Self Care | Payer: 59 | Source: Ambulatory Visit | Attending: Family Medicine | Admitting: Family Medicine

## 2020-09-10 VITALS — Ht 66.0 in | Wt 160.0 lb

## 2020-09-10 DIAGNOSIS — M545 Low back pain, unspecified: Secondary | ICD-10-CM | POA: Insufficient documentation

## 2020-09-10 NOTE — Patient Instructions (Signed)
Nice to meet you Please try the exercises  Please try heat or salone pas  I will call with the results   Please send me a message in MyChart with any questions or updates.  Please see me back in 4 weeks.   --Dr. Anell Barr DC Address: [address] [place], [place], [place] [address] Phone: [phone]
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
## 2020-09-12 ENCOUNTER — Telehealth: Payer: Self-pay | Admitting: Family Medicine

## 2020-09-12 NOTE — Telephone Encounter (Signed)
Pt informed of below.  

## 2020-09-12 NOTE — Telephone Encounter (Signed)
Left VM for patient. If he calls back please have him speak with a nurse/CMA and inform that his xrays are normal.   If any questions then please take the best time and phone number to call and I will try to call him back.   Myra Rude, MD [facility] 09/12/2020, 8:08 AM

## 2020-09-13 ENCOUNTER — Ambulatory Visit: Payer: 59 | Admitting: Family Medicine

## 2020-09-13 DIAGNOSIS — R7989 Other specified abnormal findings of blood chemistry: Secondary | ICD-10-CM | POA: Insufficient documentation

## 2020-09-13 DIAGNOSIS — M109 Gout, unspecified: Secondary | ICD-10-CM | POA: Insufficient documentation

## 2020-09-13 DIAGNOSIS — G8929 Other chronic pain: Secondary | ICD-10-CM | POA: Insufficient documentation

## 2020-09-14 DIAGNOSIS — R911 Solitary pulmonary nodule: Secondary | ICD-10-CM | POA: Insufficient documentation

## 2020-10-08 ENCOUNTER — Ambulatory Visit: Payer: 59 | Admitting: Family Medicine

## 2020-10-08 NOTE — Progress Notes (Deleted)
  Devon Burch - 45 y.o. male MRN [id number]  Date of birth: 1975/04/03  SUBJECTIVE:  Including CC & ROS.  No chief complaint on file.   Devon Burch is a 45 y.o. male that is  ***.  ***   Review of Systems See HPI   HISTORY: Past Medical, Surgical, Social, and Family History Reviewed & Updated per EMR.   Pertinent Historical Findings include:  Past Medical History:  Diagnosis Date   Anxiety    GERD (gastroesophageal reflux disease)     Past Surgical History:  Procedure Laterality Date   HEMORRHOID SURGERY     KNEE SURGERY      Family History  Problem Relation Age of Onset   Emphysema Paternal Grandmother    Heart disease Paternal Grandfather    Heart disease Maternal Grandfather     Social History   Socioeconomic History   Marital status: Divorced    Spouse name: Not on file   Number of children: Not on file   Years of education: Not on file   Highest education level: Not on file  Occupational History   Not on file  Tobacco Use   Smoking status: Never   Smokeless tobacco: Not on file  Substance and Sexual Activity   Alcohol use: Yes   Drug use: No   Sexual activity: Not on file  Other Topics Concern   Not on file  Social History Narrative   Not on file   Social Determinants of Health   Financial Resource Strain: Not on file  Food Insecurity: Not on file  Transportation Needs: Not on file  Physical Activity: Not on file  Stress: Not on file  Social Connections: Not on file  Intimate Partner Violence: Not on file     PHYSICAL EXAM:  VS: There were no vitals taken for this visit. Physical Exam Gen: NAD, alert, cooperative with exam, well-appearing MSK:  ***      ASSESSMENT & PLAN:   No problem-specific Assessment & Plan notes found for this encounter.

## 2021-01-16 ENCOUNTER — Ambulatory Visit (INDEPENDENT_AMBULATORY_CARE_PROVIDER_SITE_OTHER): Payer: 59 | Admitting: Family Medicine

## 2021-01-16 ENCOUNTER — Encounter: Payer: Self-pay | Admitting: Family Medicine

## 2021-01-16 ENCOUNTER — Ambulatory Visit: Payer: Self-pay

## 2021-01-16 VITALS — BP 128/86 | Ht 66.0 in | Wt 160.0 lb

## 2021-01-16 DIAGNOSIS — M25562 Pain in left knee: Secondary | ICD-10-CM

## 2021-01-16 DIAGNOSIS — M7652 Patellar tendinitis, left knee: Secondary | ICD-10-CM | POA: Diagnosis not present

## 2021-01-16 MED ORDER — PREDNISONE 20 MG PO TABS: ORAL_TABLET | ORAL | 0 refills | Status: DC

## 2021-01-16 NOTE — Progress Notes (Signed)
  Devon Burch - 45 y.o. male MRN [id number]  Date of birth: [birth year]  SUBJECTIVE:  Including CC & ROS.  No chief complaint on file.   Devon Burch is a 45 y.o. male that is presenting with acute left knee pain.  The pain is severe in nature.  Has been ongoing for the past 2 weeks.  Tender to the touch with some redness and warmth.   Review of Systems See HPI   HISTORY: Past Medical, Surgical, Social, and Family History Reviewed & Updated per EMR.   Pertinent Historical Findings include:  Past Medical History:  Diagnosis Date   Anxiety    GERD (gastroesophageal reflux disease)     Past Surgical History:  Procedure Laterality Date   HEMORRHOID SURGERY     KNEE SURGERY      Family History  Problem Relation Age of Onset   Emphysema Paternal Grandmother    Heart disease Paternal Grandfather    Heart disease Maternal Grandfather     Social History   Socioeconomic History   Marital status: Divorced    Spouse name: Not on file   Number of children: Not on file   Years of education: Not on file   Highest education level: Not on file  Occupational History   Not on file  Tobacco Use   Smoking status: Never   Smokeless tobacco: Not on file  Substance and Sexual Activity   Alcohol use: Yes   Drug use: No   Sexual activity: Not on file  Other Topics Concern   Not on file  Social History Narrative   Not on file   Social Determinants of Health   Financial Resource Strain: Not on file  Food Insecurity: Not on file  Transportation Needs: Not on file  Physical Activity: Not on file  Stress: Not on file  Social Connections: Not on file  Intimate Partner Violence: Not on file     PHYSICAL EXAM:  VS: BP 128/86 (BP Location: Left Arm, Patient Position: Sitting)   Ht 5\' 6"  (1.676 m)   Wt 160 lb (72.6 kg)   BMI 25.82 kg/m  Physical Exam Gen: NAD, alert, cooperative with exam, well-appearing   Limited ultrasound: Left knee:  Trace effusion in  suprapatellar pouch. Normal-appearing quadricep tendon. Increased hyperemia at the origin of the patellar tendon with hyperemia extending down to the insertion of the patellar tendon. Normal-appearing medial and lateral joint space.  Summary: Inflammatory patellar tendinitis  Ultrasound and interpretation by , MD     ASSESSMENT & PLAN:   Patellar tendinitis of left knee Appears to be an inflammatory patellar tendinitis.  No soft tissue cobblestoning and no joint involvement.  Does have a history of gout which could be the source. -Counseled on home exercise therapy and supportive care. -Prednisone. - consider further lab work.

## 2021-01-16 NOTE — Patient Instructions (Signed)
Good to see you Please use ice as needed  Please take the medicine and let me know if you're not improving   Please send me a message in MyChart with any questions or updates.  Please see me back in 2 weeks.   --Dr. Jordan Likes

## 2021-01-16 NOTE — Assessment & Plan Note (Addendum)
Appears to be an inflammatory patellar tendinitis.  No soft tissue cobblestoning and no joint involvement.  Does have a history of gout which could be the source. -Counseled on home exercise therapy and supportive care. -Prednisone. - consider further lab work.

## 2021-01-22 ENCOUNTER — Ambulatory Visit (INDEPENDENT_AMBULATORY_CARE_PROVIDER_SITE_OTHER): Payer: 59 | Admitting: Family Medicine

## 2021-01-22 ENCOUNTER — Encounter: Payer: Self-pay | Admitting: Family Medicine

## 2021-01-22 VITALS — BP 124/84 | Ht 66.0 in | Wt 160.0 lb

## 2021-01-22 DIAGNOSIS — M1A0710 Idiopathic chronic gout, right ankle and foot, without tophus (tophi): Secondary | ICD-10-CM | POA: Diagnosis not present

## 2021-01-22 DIAGNOSIS — M7652 Patellar tendinitis, left knee: Secondary | ICD-10-CM | POA: Diagnosis not present

## 2021-01-22 MED ORDER — INDOMETHACIN 50 MG PO CAPS: 50.0000 mg | ORAL_CAPSULE | Freq: Two times a day (BID) | ORAL | 1 refills | Status: DC

## 2021-01-22 NOTE — Assessment & Plan Note (Signed)
Likely seems inflammatory.  Has gotten improvement thus far. -Counseled on home exercise therapy and supportive care. -Sed rate, CRP, CBC with differential, CMP and ANA panel.

## 2021-01-22 NOTE — Progress Notes (Signed)
°  Devon Burch - 45 y.o. male MRN [id number]  Date of birth: Dec 09, 1975  SUBJECTIVE:  Including CC & ROS.  No chief complaint on file.   Devon Burch is a 45 y.o. male that is following up for his left knee pain.  He had irritability while being on the prednisone so stopped it after 3 days.  He was subsequently started on antibiotics for his ear pain.  Either way his left knee has improved but has some residual pain over the patella.  There is no swelling or redness.    Review of Systems See HPI   HISTORY: Past Medical, Surgical, Social, and Family History Reviewed & Updated per EMR.   Pertinent Historical Findings include:  Past Medical History:  Diagnosis Date   Anxiety    GERD (gastroesophageal reflux disease)     Past Surgical History:  Procedure Laterality Date   HEMORRHOID SURGERY     KNEE SURGERY      Family History  Problem Relation Age of Onset   Emphysema Paternal Grandmother    Heart disease Paternal Grandfather    Heart disease Maternal Grandfather     Social History   Socioeconomic History   Marital status: Divorced    Spouse name: Not on file   Number of children: Not on file   Years of education: Not on file   Highest education level: Not on file  Occupational History   Not on file  Tobacco Use   Smoking status: Never   Smokeless tobacco: Not on file  Substance and Sexual Activity   Alcohol use: Yes   Drug use: No   Sexual activity: Not on file  Other Topics Concern   Not on file  Social History Narrative   Not on file   Social Determinants of Health   Financial Resource Strain: Not on file  Food Insecurity: Not on file  Transportation Needs: Not on file  Physical Activity: Not on file  Stress: Not on file  Social Connections: Not on file  Intimate Partner Violence: Not on file     PHYSICAL EXAM:  VS: BP 124/84 (BP Location: Left Arm, Patient Position: Sitting)    Ht 5\' 6"  (1.676 m)    Wt 160 lb (72.6 kg)    BMI 25.82 kg/m   Physical Exam Gen: NAD, alert, cooperative with exam, well-appearing     ASSESSMENT & PLAN:   Chronic idiopathic gout involving toe of right foot without tophus Has a history of gout in his toe -Uric acid. -May need consider allopurinol.  Patellar tendinitis of left knee Likely seems inflammatory.  Has gotten improvement thus far. -Counseled on home exercise therapy and supportive care. -Sed rate, CRP, CBC with differential, CMP and ANA panel.

## 2021-01-22 NOTE — Assessment & Plan Note (Signed)
Has a history of gout in his toe -Uric acid. -May need consider allopurinol.

## 2021-01-22 NOTE — Patient Instructions (Signed)
Good to see you Please use ice as needed  Please use the indocin until you have 2 days of no pain.  I will call with the results.   Please send me a message in MyChart with any questions or updates.  Please see me back in 4 weeks.   --Dr. Jordan Likes

## 2021-01-24 LAB — ANA,IFA RA DIAG PNL W/RFLX TIT/PATN
ANA Titer 1: NEGATIVE
Cyclic Citrullin Peptide Ab: 5 units (ref 0–19)
Rheumatoid fact SerPl-aCnc: <10 IU/mL (ref <14.0)

## 2021-01-24 LAB — CBC WITH DIFFERENTIAL
Basophils Absolute: 0.1 10*3/uL (ref 0.0–0.2)
Basos: 1 %
EOS (ABSOLUTE): 0.2 10*3/uL (ref 0.0–0.4)
Eos: 2 %
Hematocrit: 41.8 % (ref 37.5–51.0)
Hemoglobin: 15 g/dL (ref 13.0–17.7)
Immature Grans (Abs): 0 10*3/uL (ref 0.0–0.1)
Immature Granulocytes: 0 %
Lymphocytes Absolute: 1.8 10*3/uL (ref 0.7–3.1)
Lymphs: 23 %
MCH: 30.9 pg (ref 26.6–33.0)
MCHC: 35.9 g/dL — ABNORMAL HIGH (ref 31.5–35.7)
MCV: 86 fL (ref 79–97)
Monocytes Absolute: 0.7 10*3/uL (ref 0.1–0.9)
Monocytes: 9 %
Neutrophils Absolute: 5.1 10*3/uL (ref 1.4–7.0)
Neutrophils: 65 %
RBC: 4.86 x10E6/uL (ref 4.14–5.80)
RDW: 12.4 % (ref 11.6–15.4)
WBC: 7.9 10*3/uL (ref 3.4–10.8)

## 2021-01-24 LAB — COMPREHENSIVE METABOLIC PANEL
ALT: 57 IU/L — ABNORMAL HIGH (ref 0–44)
AST: 27 IU/L (ref 0–40)
Albumin/Globulin Ratio: 2.1 (ref 1.2–2.2)
Albumin: 5 g/dL (ref 4.0–5.0)
Alkaline Phosphatase: 127 IU/L — ABNORMAL HIGH (ref 44–121)
BUN/Creatinine Ratio: 13 (ref 9–20)
BUN: 11 mg/dL (ref 6–24)
Bilirubin Total: 0.4 mg/dL (ref 0.0–1.2)
CO2: 23 mmol/L (ref 20–29)
Calcium: 10.2 mg/dL (ref 8.7–10.2)
Chloride: 102 mmol/L (ref 96–106)
Creatinine, Ser: 0.86 mg/dL (ref 0.76–1.27)
Globulin, Total: 2.4 g/dL (ref 1.5–4.5)
Glucose: 96 mg/dL (ref 70–99)
Potassium: 4.3 mmol/L (ref 3.5–5.2)
Sodium: 143 mmol/L (ref 134–144)
Total Protein: 7.4 g/dL (ref 6.0–8.5)
eGFR: 110 mL/min/{1.73_m2} (ref >59)

## 2021-01-24 LAB — SEDIMENTATION RATE: Sed Rate: 10 mm/hr (ref 0–15)

## 2021-01-24 LAB — URIC ACID: Uric Acid: 7.8 mg/dL (ref 3.8–8.4)

## 2021-01-24 LAB — C-REACTIVE PROTEIN: CRP: 1 mg/L (ref 0–10)

## 2021-01-25 ENCOUNTER — Telehealth: Payer: Self-pay | Admitting: Family Medicine

## 2021-01-25 NOTE — Telephone Encounter (Signed)
Pt informed of below.  He will continue with plan as discussed on 01/22/21.

## 2021-01-25 NOTE — Telephone Encounter (Signed)
Left VM for patient. If he calls back please have him speak with a nurse/CMA and inform that his lab work does not show any infection.  His uric acid was elevated Jordan Likes could consider medication to help control this going forward..   If any questions then please take the best time and phone number to call and I will try to call him back.   Myra Rude, MD [facility] 01/25/2021, 9:07 AM

## 2021-01-30 ENCOUNTER — Ambulatory Visit: Payer: 59 | Admitting: Family Medicine

## 2021-04-30 ENCOUNTER — Encounter (HOSPITAL_BASED_OUTPATIENT_CLINIC_OR_DEPARTMENT_OTHER): Payer: Self-pay

## 2021-04-30 ENCOUNTER — Emergency Department (HOSPITAL_BASED_OUTPATIENT_CLINIC_OR_DEPARTMENT_OTHER): Payer: 59

## 2021-04-30 ENCOUNTER — Other Ambulatory Visit: Payer: Self-pay

## 2021-04-30 ENCOUNTER — Emergency Department (HOSPITAL_BASED_OUTPATIENT_CLINIC_OR_DEPARTMENT_OTHER)
Admission: EM | Admit: 2021-04-30 | Discharge: 2021-05-01 | Disposition: A | Discharge status: Home / Self Care | Payer: 59 | Attending: Emergency Medicine | Admitting: Emergency Medicine

## 2021-04-30 DIAGNOSIS — R531 Weakness: Secondary | ICD-10-CM | POA: Diagnosis not present

## 2021-04-30 DIAGNOSIS — H5712 Ocular pain, left eye: Secondary | ICD-10-CM | POA: Insufficient documentation

## 2021-04-30 DIAGNOSIS — R519 Headache, unspecified: Secondary | ICD-10-CM

## 2021-04-30 LAB — CBC WITH DIFFERENTIAL/PLATELET
Abs Immature Granulocytes: 0.05 10*3/uL (ref 0.00–0.07)
Basophils Absolute: 0.1 10*3/uL (ref 0.0–0.1)
Basophils Relative: 1 %
Eosinophils Absolute: 0.2 10*3/uL (ref 0.0–0.5)
Eosinophils Relative: 3 %
HCT: 40.9 % (ref 39.0–52.0)
Hemoglobin: 14.3 g/dL (ref 13.0–17.0)
Immature Granulocytes: 1 %
Lymphocytes Relative: 29 %
Lymphs Abs: 2.4 10*3/uL (ref 0.7–4.0)
MCH: 30.2 pg (ref 26.0–34.0)
MCHC: 35 g/dL (ref 30.0–36.0)
MCV: 86.5 fL (ref 80.0–100.0)
Monocytes Absolute: 0.8 10*3/uL (ref 0.1–1.0)
Monocytes Relative: 10 %
Neutro Abs: 4.7 10*3/uL (ref 1.7–7.7)
Neutrophils Relative %: 56 %
Platelets: 262 10*3/uL (ref 150–400)
RBC: 4.73 MIL/uL (ref 4.22–5.81)
RDW: 13.3 % (ref 11.5–15.5)
WBC: 8.2 10*3/uL (ref 4.0–10.5)
nRBC: 0 % (ref 0.0–0.2)

## 2021-04-30 LAB — BASIC METABOLIC PANEL
Anion gap: 9 (ref 5–15)
BUN: 14 mg/dL (ref 6–20)
CO2: 23 mmol/L (ref 22–32)
Calcium: 9.4 mg/dL (ref 8.9–10.3)
Chloride: 104 mmol/L (ref 98–111)
Creatinine, Ser: 1.11 mg/dL (ref 0.61–1.24)
GFR, Estimated: >60 mL/min (ref >60)
Glucose, Bld: 121 mg/dL — ABNORMAL HIGH (ref 70–99)
Potassium: 3.8 mmol/L (ref 3.5–5.1)
Sodium: 136 mmol/L (ref 135–145)

## 2021-04-30 MED ORDER — IOHEXOL 300 MG/ML  SOLN
80.0000 mL | Freq: Once | INTRAMUSCULAR | Status: AC | PRN
  Administered 2021-04-30: 80 mL via INTRAVENOUS

## 2021-04-30 NOTE — ED Provider Notes (Signed)
?[facility] EMERGENCY DEPARTMENT ?Provider Note ? ? ?CSN: [id number] ?Arrival date & time: 04/30/21  2015 ? ?  ? ?History ? ?Chief Complaint  ?Patient presents with  ? Eye Pain  ? ?Garwood Wentzell is a 46 year-old-male with a pertinent history of TBI, anxiety, and chronic pansinusitis who presents to the ED for complaints of left eye pain and left sided weakness. Patient states that he has been seen by ophthalmology last week who states his eyes have no issues. He has also seen his PCP for his left sided weakness and was referred to an orthopedist and has an MRI scheduled. Patient states he presents to the ED today for these issues because he "wants to know there is nothing going on in my brain." Patient said his left eye pain started about one month ago described as pressure with difficulty focusing. He has intermittent dizziness , blurred vision, and headache. He has seen his PCP and ENT for his chronic pansinusitis and went through two rounds of Azithromycin without improvement of symptoms. Patient claims he is not currently taking antibiotics due to the GI upset. His left sided weakness started in November of 2022 and has progressed up his left lower extremity. He describes his symptoms as tingling, muscle weakness, and numbness. He denies any issues ambulating. to know there is nothing going on in [his] brain." Patient said his left eye pain started about one month ago described as pressure with difficulty focusing. He endorses dizziness and headache. He has seen his PCP and ENT for his chronic pansinusitis and went through two rounds of Azithromycin without improvement of symptoms. Patient claims he is not currently taking antibiotics due to the GI upset. His left sided weakness started in November of 2022 and has progressed up his left lower extremity. He describes his symptoms as tingling, muscle weakness, and numbness. He denies any issues ambulating.  ? ? ?Eye Pain ? ? ?  ? ?Home  Medications ?Prior to Admission medications   ?Medication Sig Start Date End Date Taking? Authorizing Provider  ?ALPRAZolam (XANAX PO) Take by mouth.    [provider]  ?amitriptyline (ELAVIL) 50 MG tablet Take 75 mg by mouth at bedtime.    [provider]  ?clonazePAM (KLONOPIN) 0.5 MG tablet Take 0.5 mg by mouth daily.    [provider]  ?esomeprazole (NEXIUM) 10 MG packet Take 10 mg by mouth daily before breakfast.    [provider]  ?indomethacin (INDOCIN) 50 MG capsule Take 1 capsule (50 mg total) by mouth 2 (two) times daily with a meal. 01/22/21   Myra Rude, MD  ?methocarbamol (ROBAXIN) 500 MG tablet Take 1 tablet (500 mg total) by mouth every 6 (six) hours as needed for muscle spasms. 09/07/20   Dione Booze, MD  ?Multiple Vitamin (ONE-A-DAY MENS PO) Take 1 tablet by mouth daily.    [provider]  ?pantoprazole (PROTONIX) 40 MG tablet Take 1 tablet (40 mg total) by mouth daily. Take 30-60 min before first meal of the day 05/25/12   Nyoka Cowden, MD  ?predniSONE (DELTASONE) 20 MG tablet TAKE 3 TABS ONCE DAILY FOR 3 DAYS, 2 FOR 3 DAYS, 1 FOR 2 DAYS THEN 1/2 FOR 2 DAYS 01/16/21   Myra Rude, MD  ?ranitidine (ZANTAC) 150 MG tablet 2 at bedtime 05/25/12   Nyoka Cowden, MD  ?sertraline (ZOLOFT) 25 MG tablet Take 25 mg by mouth daily.    [provider]  ?traMADol (ULTRAM) 50 MG  tablet Take 1 tablet (50 mg total) by mouth every 6 (six) hours as needed. 09/07/20   Dione BoozeGlick, David, MD  ?   ? ?Allergies    ?Sulfa antibiotics and Penicillins   ? ?Review of Systems   ?Review of Systems  ?Eyes:  Positive for pain.  ? ?Physical Exam ?Updated Vital Signs ?BP (!) 135/95 (BP Location: Left Arm)   Pulse 79   Temp 98.1 ?F (36.7 ?C) (Oral)   Resp 18   Ht 5\' 6"  (1.676 m)   Wt 77.1 kg   SpO2 100%   BMI 27.44 kg/m?  ?Physical Exam ?Vitals and nursing note reviewed.  ?Constitutional:   ?   General: He is not in acute distress. ?   Appearance: He is  well-developed. He is not diaphoretic.  ?HENT:  ?   Head: Normocephalic and atraumatic.  ?Eyes:  ?   General: Vision grossly intact. No scleral icterus. ?   Extraocular Movements: Extraocular movements intact.  ?   Conjunctiva/sclera: Conjunctivae normal.  ?   Pupils: Pupils are equal, round, and reactive to light.  ?   Comments: Visual fields full to confrontation ?No obvious proptosis ?Vision is grossly intact ?Conjunctivae no obvious neurologic deficits or weakness on examination are normal  ?Cardiovascular:  ?   Rate and Rhythm: Normal rate and regular rhythm.  ?   Heart sounds: Normal heart sounds.  ?Pulmonary:  ?   Effort: Pulmonary effort is normal. No respiratory distress.  ?   Breath sounds: Normal breath sounds.  ?Abdominal:  ?   Palpations: Abdomen is soft.  ?   Tenderness: There is no abdominal tenderness.  ?Musculoskeletal:  ?   Cervical back: Normal range of motion and neck supple.  ?Skin: ?   General: Skin is warm and dry.  ?Neurological:  ?   Mental Status: He is alert.  ?   GCS: GCS eye subscore is 4. GCS verbal subscore is 5. GCS motor subscore is 6.  ?   Cranial Nerves: Cranial nerves 2-12 are intact.  ?   Sensory: Sensation is intact.  ?   Motor: Motor function is intact. No weakness.  ?   Coordination: Coordination is intact.  ?   Gait: Gait is intact.  ?   Deep Tendon Reflexes:  ?   Reflex Scores: ?     Patellar reflexes are 2+ on the right side and 2+ on the left side. ?Psychiatric:     ?   Behavior: Behavior normal.  ? ? ?ED Results / Procedures / Treatments   ?Labs ?(all labs ordered are listed, but only abnormal results are displayed) ?Labs Reviewed - No data to display ? ?EKG ?None ? ?Radiology ?No results found. ? ?Procedures ?Procedures  ? ? ?Medications Ordered in ED ?Medications - No data to display ? ?ED Course/ Medical Decision Making/ A&P ?  ?                        ?Medical Decision Making ?Patient here with complaint of vision changes, sensation of pressure behind the left eye and  weakness.  Differential diagnosis includes brain mass, MS.  Less likely to be orbital cellulitis orbital abscess. ?I ordered and reviewed labs including CBC and BMP which showed no acute abnormalities.  Patient currently has a CT head and maxillofacial ordered and pending.  I discussed the need for outpatient follow-up and we will give the patient ambulatory referral to neurology for further evaluation if CTs returned  without abnormality.  I reviewed these images and do not see any abnormal findings.  Currently awaiting radiologic interpretation.  I have given signout to Dr. Judd Lien who will assume care of the patient for appropriate discharge. ? ?Amount and/or Complexity of Data Reviewed ?Labs: ordered. ?Radiology: ordered. ? ?Risk ?Prescription drug management. ? ? ? ? ? ?  ? ? ? ? ?Final Clinical Impression(s) / ED Diagnoses ?Final diagnoses:  ?None  ? ? ?Rx / DC Orders ?ED Discharge Orders   ? ? None  ? ?  ? ? ?  ?Arthor Captain, PA-C ?05/02/21 1201 ? ?  ?Virgina Norfolk, DO ?05/04/21 0020 ? ?

## 2021-04-30 NOTE — ED Notes (Signed)
Patient transported to CT 

## 2021-04-30 NOTE — ED Triage Notes (Signed)
Patient presents with complaint of pain and bulging behind L eye.  He also complains of cramping and weakness to L side of his body.  He states he has seen PCP and ENT and was diagnosed with sinus infection.  Took antibiotic azithromycin with no relief of symptoms.  ?

## 2021-05-01 NOTE — ED Notes (Signed)
ED Provider at bedside. 

## 2021-05-08 ENCOUNTER — Ambulatory Visit (INDEPENDENT_AMBULATORY_CARE_PROVIDER_SITE_OTHER): Payer: Commercial Managed Care - PPO | Admitting: Neurology

## 2021-05-08 ENCOUNTER — Encounter: Payer: Self-pay | Admitting: Neurology

## 2021-05-08 VITALS — BP 150/91 | HR 93 | Wt 179.0 lb

## 2021-05-08 DIAGNOSIS — S069X9S Unspecified intracranial injury with loss of consciousness of unspecified duration, sequela: Secondary | ICD-10-CM

## 2021-05-08 DIAGNOSIS — G43701 Chronic migraine without aura, not intractable, with status migrainosus: Secondary | ICD-10-CM

## 2021-05-08 MED ORDER — METHYLPREDNISOLONE 4 MG PO TBPK: ORAL_TABLET | ORAL | 0 refills | Status: DC

## 2021-05-08 MED ORDER — SUMATRIPTAN SUCCINATE 50 MG PO TABS: 50.0000 mg | ORAL_TABLET | ORAL | 3 refills | Status: AC | PRN

## 2021-05-08 MED ORDER — PROPRANOLOL HCL 20 MG PO TABS: 20.0000 mg | ORAL_TABLET | Freq: Two times a day (BID) | ORAL | 6 refills | Status: DC

## 2021-05-08 NOTE — Patient Instructions (Signed)
MRI Brain with and without contrast  ?Medrol dose pack  ?Start with Propanolol 20 BID for migraine prevention  ?Sumatriptan as needed for the headaches  ?Follow up in 6 months  ?

## 2021-05-08 NOTE — Progress Notes (Signed)
? ?[facility] ? ?PATIENT: Devon Burch ?DOB: Apr 17, 1975 ? ?REQUESTING CLINICIAN: Geoffery Lyonselo, Douglas, MD ?HISTORY FROM: Patient  ?REASON FOR VISIT: Migraines headaches  ? ? ?HISTORICAL ? ?CHIEF COMPLAINT:  ?Chief Complaint  ?Patient presents with  ? New Patient (Initial Visit)  ?  Room 14, alone ?NP internal referral for Left eye pain, bad headache ?today c/o Right eye pressure, daily headaches,  currently not taking medication for migraines, has taken sumatriptan in the past but did not like how it made him feel   ? ? ?HISTORY OF PRESENT ILLNESS:  ?This is a 46 year old gentleman past medical history of TBI diagnosed in 2010, migraine also diagnosed in 2010, who is presenting for worsening headache.  Patient reports for his migraine headache he has been in the past on amitriptyline and sumatriptan, amitriptyline did not help prevent his migraines and he has only tried the sumatriptan once or twice.  He reports for the past month and a half, he has been having daily headaches, described as pressure behind left eye and sometimes associated with left sided weakness.  He has been taking ibuprofen and Tylenol without much relief.  Headaches associated with phonophobia, nausea and vomiting.   ?Patient reported amitriptyline has not been helping preventive the headaches, he also had dry mouth with the medication and he started weaning himself off, currently he is taking 10 mg nightly. ? ? ?Headache History and Characteristics: ?Onset: 1 month and half ?Location: left side  ?Quality:  Pressure, vision changes  ?Intensity: 10/10.  ?Duration: Daily, last all day  ?Migrainous Features: Phonophobia, nausea, vomiting.  ?Aura: No  ?History of brain injury or tumor: No ? ?Family history: Mother  ?Motion sickness: no ?Cardiac history: no ? ?OTC: tylenol, Ibuprofen ? ?Prior prophylaxis: ?Propranolol: No  ?Verapamil:No ?TCA: Yes, amitriptyline ?Topamax: No ?Depakote: No ?Effexor: No ?Cymbalta:  No ?Neurontin:No ? ?Prior abortives: ?Triptan: Yes, made him feel weird  ?Anti-emetic: No ?Steroids: No ?Ergotamine suppository: No ? ? ? ?OTHER MEDICAL CONDITIONS: History of TBI, migraine headache, pansinusitis ? ? ?REVIEW OF SYSTEMS: Full 14 system review of systems performed and negative with exception of: As noted in the HPI ? ?ALLERGIES: ?Allergies  ?Allergen Reactions  ? Sulfa Antibiotics Shortness Of Breath  ? Penicillins Other (See Comments)  ?  Unknown reaction as a child  ? ? ?HOME MEDICATIONS: ?Outpatient Medications Prior to Visit  ?Medication Sig Dispense Refill  ? ALPRAZolam (XANAX PO) Take by mouth.    ? amitriptyline (ELAVIL) 50 MG tablet Take 75 mg by mouth at bedtime.    ? Multiple Vitamin (ONE-A-DAY MENS PO) Take 1 tablet by mouth daily.    ? pantoprazole (PROTONIX) 40 MG tablet Take 1 tablet (40 mg total) by mouth daily. Take 30-60 min before first meal of the day 30 tablet 2  ? clonazePAM (KLONOPIN) 0.5 MG tablet Take 0.5 mg by mouth daily.    ? esomeprazole (NEXIUM) 10 MG packet Take 10 mg by mouth daily before breakfast.    ? indomethacin (INDOCIN) 50 MG capsule Take 1 capsule (50 mg total) by mouth 2 (two) times daily with a meal. 60 capsule 1  ? methocarbamol (ROBAXIN) 500 MG tablet Take 1 tablet (500 mg total) by mouth every 6 (six) hours as needed for muscle spasms. 40 tablet 0  ? predniSONE (DELTASONE) 20 MG tablet TAKE 3 TABS ONCE DAILY FOR 3 DAYS, 2 FOR 3 DAYS, 1 FOR 2 DAYS THEN 1/2 FOR 2 DAYS 18 tablet 0  ? ranitidine (ZANTAC)  150 MG tablet 2 at bedtime    ? sertraline (ZOLOFT) 25 MG tablet Take 25 mg by mouth daily.    ? traMADol (ULTRAM) 50 MG tablet Take 1 tablet (50 mg total) by mouth every 6 (six) hours as needed. 10 tablet 0  ? ?No facility-administered medications prior to visit.  ? ? ?PAST MEDICAL HISTORY: ?Past Medical History:  ?Diagnosis Date  ? Anxiety   ? GERD (gastroesophageal reflux disease)   ? ? ?PAST SURGICAL HISTORY: ?Past Surgical History:  ?Procedure Laterality  Date  ? HEMORRHOID SURGERY    ? KNEE SURGERY    ? ? ?FAMILY HISTORY: ?Family History  ?Problem Relation Age of Onset  ? Emphysema Paternal Grandmother   ? Heart disease Paternal Grandfather   ? Heart disease Maternal Grandfather   ? ? ?SOCIAL HISTORY: ?Social History  ? ?Socioeconomic History  ? Marital status: Divorced  ?  Spouse name: Not on file  ? Number of children: Not on file  ? Years of education: Not on file  ? Highest education level: Not on file  ?Occupational History  ? Not on file  ?Tobacco Use  ? Smoking status: Never  ?  Passive exposure: Never  ? Smokeless tobacco: Not on file  ?Vaping Use  ? Vaping Use: Never used  ?Substance and Sexual Activity  ? Alcohol use: Yes  ? Drug use: No  ? Sexual activity: Not Currently  ?Other Topics Concern  ? Not on file  ?Social History Narrative  ? Not on file  ? ?Social Determinants of Health  ? ?Financial Resource Strain: Not on file  ?Food Insecurity: Not on file  ?Transportation Needs: Not on file  ?Physical Activity: Not on file  ?Stress: Not on file  ?Social Connections: Not on file  ?Intimate Partner Violence: Not on file  ? ? ?PHYSICAL EXAM ? ?GENERAL EXAM/CONSTITUTIONAL: ?Vitals:  ?Vitals:  ? 05/08/21 0902  ?BP: (!) 150/91  ?Pulse: 93  ?Weight: 179 lb (81.2 kg)  ? ?Body mass index is 28.89 kg/m?. ?Wt Readings from Last 3 Encounters:  ?05/08/21 179 lb (81.2 kg)  ?04/30/21 170 lb (77.1 kg)  ?01/22/21 160 lb (72.6 kg)  ? ?Patient is in no distress; well developed, nourished and groomed; neck is supple ? ?EYES: ?Pupils round and reactive to light, Visual fields full to confrontation, Extraocular movements intacts,  ? ?MUSCULOSKELETAL: ?Gait, strength, tone, movements noted in Neurologic exam below ? ?NEUROLOGIC: ?MENTAL STATUS:  ?   ? View : No data to display.  ?  ?  ?  ? ?awake, alert, oriented to person, place and time ?recent and remote memory intact ?normal attention and concentration ?language fluent, comprehension intact, naming intact ?fund of knowledge  appropriate ? ?CRANIAL NERVE:  ?2nd, 3rd, 4th, 6th - pupils equal and reactive to light, visual fields full to confrontation, extraocular muscles intact, no nystagmus ?5th - facial sensation symmetric ?7th - facial strength symmetric ?8th - hearing intact ?9th - palate elevates symmetrically, uvula midline ?11th - shoulder shrug symmetric ?12th - tongue protrusion midline ? ?MOTOR:  ?normal bulk and tone, full strength in the BUE, BLE ? ?SENSORY:  ?normal and symmetric to light touch, pinprick, temperature, vibration ? ?COORDINATION:  ?finger-nose-finger, fine finger movements normal ? ?REFLEXES:  ?deep tendon reflexes present and symmetric ? ?GAIT/STATION:  ?normal ? ? ?DIAGNOSTIC DATA (LABS, IMAGING, TESTING) ?- I reviewed patient records, labs, notes, testing and imaging myself where available. ? ?Lab Results  ?Component Value Date  ? WBC 8.2 04/30/2021  ?  HGB 14.3 04/30/2021  ? HCT 40.9 04/30/2021  ? MCV 86.5 04/30/2021  ? PLT 262 04/30/2021  ? ?   ?Component Value Date/Time  ? NA 136 04/30/2021 2318  ? NA 143 01/22/2021 1554  ? NA 139 111-May-2015 0540  ? K 3.8 04/30/2021 2318  ? K 3.8 111-May-2015 0540  ? CL 104 04/30/2021 2318  ? CL 107 111-May-2015 0540  ? CO2 23 04/30/2021 2318  ? CO2 24 111-May-2015 0540  ? GLUCOSE 121 (H) 04/30/2021 2318  ? GLUCOSE 88 111-May-2015 0540  ? BUN 14 04/30/2021 2318  ? BUN 11 01/22/2021 1554  ? BUN 10 111-May-2015 0540  ? CREATININE 1.11 04/30/2021 2318  ? CREATININE 1.01 111-May-2015 0540  ? CALCIUM 9.4 04/30/2021 2318  ? CALCIUM 8.8 111-May-2015 0540  ? PROT 7.4 01/22/2021 1554  ? PROT 7.6 111-May-2015 0540  ? ALBUMIN 5.0 01/22/2021 1554  ? ALBUMIN 4.2 111-May-2015 0540  ? AST 27 01/22/2021 1554  ? AST 37 111-May-2015 0540  ? ALT 57 (H) 01/22/2021 1554  ? ALT 66 (H) 111-May-2015 0540  ? ALKPHOS 127 (H) 01/22/2021 1554  ? ALKPHOS 101 111-May-2015 0540  ? BILITOT 0.4 01/22/2021 1554  ? BILITOT 0.4 111-May-2015 0540  ? GFRNONAA >60 04/30/2021 2318  ? GFRNONAA >60 111-May-2015 0540  ? GFRAA >60 10/08/2015  1640  ? GFRAA >60 111-May-2015 0540  ? ?No results found for: CHOL, HDL, LDLCALC, LDLDIRECT, TRIG, CHOLHDL ?No results found for: HGBA1C ?No results found for: VITAMINB12 ?No results found for: TSH ? ?Head CT 04/30/21 ?No acut

## 2021-05-20 ENCOUNTER — Telehealth: Payer: Self-pay | Admitting: Neurology

## 2021-05-20 NOTE — Telephone Encounter (Signed)
[facility] auth: NPR spoke to Raheel K @ 1:59 PM on 05/20/21 order sent to GI. They will reach out to the patient to schedule.  ?

## 2021-09-02 NOTE — Progress Notes (Deleted)
Synopsis: Referred for recurrent pneumonias, dyspnea by Loyola Mast, NP  Subjective:   PATIENT ID: Devon Burch GENDER: male DOB: March 12, 1975, MRN: [id number]  No chief complaint on file.  45yM with history of anxiety, GERD referred for recurrent pneumonias, dyspnea  Seen by cardiology 06/14/21 with plan to check treadmill stress, echo, referred to GI. Had CT coronaries 07/2020 - CAD-RADS 0.   Otherwise pertinent review of systems is negative.  Past Medical History:  Diagnosis Date   Anxiety    GERD (gastroesophageal reflux disease)      Family History  Problem Relation Age of Onset   Emphysema Paternal Grandmother    Heart disease Paternal Grandfather    Heart disease Maternal Grandfather      Past Surgical History:  Procedure Laterality Date   HEMORRHOID SURGERY     KNEE SURGERY      Social History   Socioeconomic History   Marital status: Divorced    Spouse name: Not on file   Number of children: Not on file   Years of education: Not on file   Highest education level: Not on file  Occupational History   Not on file  Tobacco Use   Smoking status: Never    Passive exposure: Never   Smokeless tobacco: Not on file  Vaping Use   Vaping Use: Never used  Substance and Sexual Activity   Alcohol use: Yes   Drug use: No   Sexual activity: Not Currently  Other Topics Concern   Not on file  Social History Narrative   Not on file   Social Determinants of Health   Financial Resource Strain: Not on file  Food Insecurity: Not on file  Transportation Needs: Not on file  Physical Activity: Not on file  Stress: Not on file  Social Connections: Not on file  Intimate Partner Violence: Not on file     Allergies  Allergen Reactions   Sulfa Antibiotics Shortness Of Breath   Penicillins Other (See Comments)    Unknown reaction as a child     Outpatient Medications Prior to Visit  Medication Sig Dispense Refill   ALPRAZolam (XANAX PO) Take by mouth.      amitriptyline (ELAVIL) 50 MG tablet Take 75 mg by mouth at bedtime.     methylPREDNISolone (MEDROL DOSEPAK) 4 MG TBPK tablet 6 tabs day1, then 5 tabs day2, then 4 tabs day3, then 3 tabs day4 and 2 tabs day5 and 1 tab day6 21 tablet 0   Multiple Vitamin (ONE-A-DAY MENS PO) Take 1 tablet by mouth daily.     pantoprazole (PROTONIX) 40 MG tablet Take 1 tablet (40 mg total) by mouth daily. Take 30-60 min before first meal of the day 30 tablet 2   propranolol (INDERAL) 20 MG tablet Take 1 tablet (20 mg total) by mouth 2 (two) times daily. 60 tablet 6   SUMAtriptan (IMITREX) 50 MG tablet Take 1 tablet (50 mg total) by mouth every 2 (two) hours as needed for migraine. May repeat in 2 hours if headache persists or recurs. 10 tablet 3   No facility-administered medications prior to visit.       Objective:   Physical Exam:  General appearance: 46 y.o., male, NAD, conversant  Eyes: anicteric sclerae; PERRL, tracking appropriately HENT: NCAT; MMM Neck: Trachea midline; no lymphadenopathy, no JVD Lungs: CTAB, no crackles, no wheeze, with normal respiratory effort CV: RRR, no murmur  Abdomen: Soft, non-tender; non-distended, BS present  Extremities: No peripheral edema, warm Skin: Normal  turgor and texture; no rash Psych: Appropriate affect Neuro: Alert and oriented to person and place, no focal deficit     There were no vitals filed for this visit.   on *** LPM *** RA BMI Readings from Last 3 Encounters:  05/08/21 28.89 kg/m  04/30/21 27.44 kg/m  01/22/21 25.82 kg/m   Wt Readings from Last 3 Encounters:  05/08/21 179 lb (81.2 kg)  04/30/21 170 lb (77.1 kg)  01/22/21 160 lb (72.6 kg)     CBC    Component Value Date/Time   WBC 8.2 04/30/2021 2318   RBC 4.73 04/30/2021 2318   HGB 14.3 04/30/2021 2318   HGB 15.0 01/22/2021 1554   HCT 40.9 04/30/2021 2318   HCT 41.8 01/22/2021 1554   PLT 262 04/30/2021 2318   PLT 289 105-08-15 0540   MCV 86.5 04/30/2021 2318   MCV 86  01/22/2021 1554   MCV 90 105-08-15 0540   MCH 30.2 04/30/2021 2318   MCHC 35.0 04/30/2021 2318   RDW 13.3 04/30/2021 2318   RDW 12.4 01/22/2021 1554   RDW 13.0 105-08-15 0540   LYMPHSABS 2.4 04/30/2021 2318   LYMPHSABS 1.8 01/22/2021 1554   MONOABS 0.8 04/30/2021 2318   EOSABS 0.2 04/30/2021 2318   EOSABS 0.2 01/22/2021 1554   BASOSABS 0.1 04/30/2021 2318   BASOSABS 0.1 01/22/2021 1554    ***  Chest Imaging: CXR 2014 reviewed by me unremarkable  CT A/P lung bases 2017 reviewed by me unremarkable  CT Coronaries 2022 OSH report reviewed by me normal  Pulmonary Functions Testing Results:     No data to display            Echocardiogram: ***  Heart Catheterization: ***    Assessment & Plan:    Plan:      Omar Person, MD [facility] 09/02/2021 3:18 PM

## 2021-09-03 ENCOUNTER — Institutional Professional Consult (permissible substitution): Payer: 59 | Admitting: Student

## 2021-09-05 ENCOUNTER — Emergency Department (HOSPITAL_BASED_OUTPATIENT_CLINIC_OR_DEPARTMENT_OTHER)
Admission: EM | Admit: 2021-09-05 | Discharge: 2021-09-05 | Disposition: A | Discharge status: Home / Self Care | Payer: Commercial Managed Care - PPO | Attending: Emergency Medicine | Admitting: Emergency Medicine

## 2021-09-05 ENCOUNTER — Encounter (HOSPITAL_BASED_OUTPATIENT_CLINIC_OR_DEPARTMENT_OTHER): Payer: Self-pay | Admitting: Emergency Medicine

## 2021-09-05 ENCOUNTER — Other Ambulatory Visit: Payer: Self-pay

## 2021-09-05 DIAGNOSIS — Z79899 Other long term (current) drug therapy: Secondary | ICD-10-CM | POA: Diagnosis not present

## 2021-09-05 DIAGNOSIS — M545 Low back pain, unspecified: Secondary | ICD-10-CM | POA: Insufficient documentation

## 2021-09-05 DIAGNOSIS — R109 Unspecified abdominal pain: Secondary | ICD-10-CM

## 2021-09-05 DIAGNOSIS — R1011 Right upper quadrant pain: Secondary | ICD-10-CM | POA: Diagnosis present

## 2021-09-05 LAB — URINALYSIS, ROUTINE W REFLEX MICROSCOPIC
Bilirubin Urine: NEGATIVE
Glucose, UA: NEGATIVE mg/dL
Hgb urine dipstick: NEGATIVE
Ketones, ur: NEGATIVE mg/dL
Leukocytes,Ua: NEGATIVE
Nitrite: NEGATIVE
Protein, ur: NEGATIVE mg/dL
Specific Gravity, Urine: 1.025 (ref 1.005–1.030)
pH: 6 (ref 5.0–8.0)

## 2021-09-05 LAB — COMPREHENSIVE METABOLIC PANEL
ALT: 40 U/L (ref 0–44)
AST: 30 U/L (ref 15–41)
Albumin: 4.6 g/dL (ref 3.5–5.0)
Alkaline Phosphatase: 87 U/L (ref 38–126)
Anion gap: 8 (ref 5–15)
BUN: 13 mg/dL (ref 6–20)
CO2: 27 mmol/L (ref 22–32)
Calcium: 9.2 mg/dL (ref 8.9–10.3)
Chloride: 106 mmol/L (ref 98–111)
Creatinine, Ser: 1.12 mg/dL (ref 0.61–1.24)
GFR, Estimated: >60 mL/min (ref >60)
Glucose, Bld: 100 mg/dL — ABNORMAL HIGH (ref 70–99)
Potassium: 4.1 mmol/L (ref 3.5–5.1)
Sodium: 141 mmol/L (ref 135–145)
Total Bilirubin: 1 mg/dL (ref 0.3–1.2)
Total Protein: 7.9 g/dL (ref 6.5–8.1)

## 2021-09-05 LAB — CBC
HCT: 44.3 % (ref 39.0–52.0)
Hemoglobin: 14.8 g/dL (ref 13.0–17.0)
MCH: 29.6 pg (ref 26.0–34.0)
MCHC: 33.4 g/dL (ref 30.0–36.0)
MCV: 88.6 fL (ref 80.0–100.0)
Platelets: 313 10*3/uL (ref 150–400)
RBC: 5 MIL/uL (ref 4.22–5.81)
RDW: 13.1 % (ref 11.5–15.5)
WBC: 7.9 10*3/uL (ref 4.0–10.5)
nRBC: 0 % (ref 0.0–0.2)

## 2021-09-05 LAB — LIPASE, BLOOD: Lipase: 36 U/L (ref 11–51)

## 2021-09-05 NOTE — ED Provider Notes (Signed)
[facility] EMERGENCY DEPARTMENT Provider Note   CSN: [id number] Arrival date & time: 09/05/21  1409     History  Chief Complaint  Patient presents with   Abdominal Pain    Devon Burch is a 46 y.o. male.  Devon Burch is a 46 year old male with a past medical history of GERD and traumatic brain injury with associated migraine sequelae presenting with 1 week of constipation, epigastric abdominal pain, and 1 day of worsening right upper quadrant pain radiating to his right flank and low right back.  He states that he has had similar epigastric pain in the past and was diagnosed with GERD which he was switched from pantoprazole to omeprazole a few weeks ago.  He states that he usually has about 2 bowel movements a day but now has been having 1 every couple of days for the past week.  He denies any change in the characteristics of his stool including pale stools, bloody stools, dark tarry stools.  He he does not note anything that makes the pain worse including eating.  Today he was working outside at his job when he had acute right upper quadrant pain radiating to his right flank, groin/testicle, and back with associated left quadricep cramp.  He denies any nausea or vomiting associated with any of his pains.  He denies any recent illnesses, fevers, lightheadedness, syncope, shortness of breath, or exertional chest pain.  He does have an appointment with urology next week which he made due to decreased urine stream.   Abdominal Pain Associated symptoms: constipation   Associated symptoms: no chest pain, no chills, no diarrhea, no dysuria, no fever, no hematuria, no nausea, no shortness of breath and no vomiting        Home Medications Prior to Admission medications   Medication Sig Start Date End Date Taking? Authorizing Provider  ALPRAZolam (XANAX PO) Take by mouth.    [provider]  amitriptyline (ELAVIL) 50 MG tablet Take 75 mg by mouth at bedtime.     [provider]  methylPREDNISolone (MEDROL DOSEPAK) 4 MG TBPK tablet 6 tabs day1, then 5 tabs day2, then 4 tabs day3, then 3 tabs day4 and 2 tabs day5 and 1 tab day6 05/08/21   Windell Norfolk, MD  Multiple Vitamin (ONE-A-DAY MENS PO) Take 1 tablet by mouth daily.    [provider]  pantoprazole (PROTONIX) 40 MG tablet Take 1 tablet (40 mg total) by mouth daily. Take 30-60 min before first meal of the day 05/25/12   Nyoka Cowden, MD  propranolol (INDERAL) 20 MG tablet Take 1 tablet (20 mg total) by mouth 2 (two) times daily. 05/08/21 06/07/21  Windell Norfolk, MD  SUMAtriptan (IMITREX) 50 MG tablet Take 1 tablet (50 mg total) by mouth every 2 (two) hours as needed for migraine. May repeat in 2 hours if headache persists or recurs. 05/08/21   Windell Norfolk, MD      Allergies    Sulfa antibiotics and Penicillins    Review of Systems   Review of Systems  Constitutional:  Negative for chills and fever.  Respiratory:  Negative for shortness of breath.   Cardiovascular:  Negative for chest pain and leg swelling.  Gastrointestinal:  Positive for abdominal pain and constipation. Negative for blood in stool, diarrhea, nausea and vomiting.  Genitourinary:  Positive for flank pain and testicular pain. Negative for dysuria and hematuria.  Musculoskeletal:  Positive for back pain. Negative for joint swelling, neck pain and neck stiffness.  Skin:  Negative for pallor and rash.  Neurological:  Negative for syncope, weakness and light-headedness.  Psychiatric/Behavioral:  Negative for decreased concentration.     Physical Exam Updated Vital Signs BP (!) 132/99   Pulse 67   Temp 98.1 F (36.7 C) (Oral)   Resp 17   Ht 5\' 6"  (1.676 m)   Wt 75.3 kg   SpO2 99%   BMI 26.79 kg/m  Physical Exam Vitals reviewed.  Constitutional:      General: He is not in acute distress.    Appearance: He is well-developed and normal weight.  Cardiovascular:     Rate and Rhythm: Normal rate and  regular rhythm.  Pulmonary:     Effort: Pulmonary effort is normal.     Breath sounds: Normal breath sounds.  Abdominal:     General: Bowel sounds are normal. There is no distension.     Palpations: Abdomen is soft.     Tenderness: There is abdominal tenderness in the epigastric area and suprapubic area. There is no right CVA tenderness, left CVA tenderness, guarding or rebound. Negative signs include Murphy's sign.  Genitourinary:    Comments: Patient refused urogenital exam. Musculoskeletal:     Cervical back: No bony tenderness.     Thoracic back: No bony tenderness.     Lumbar back: No bony tenderness.     Right lower leg: No edema.     Left lower leg: No edema.  Skin:    General: Skin is warm and dry.  Neurological:     Mental Status: He is alert.     Sensory: No sensory deficit.     Motor: No weakness.     ED Results / Procedures / Treatments   Labs (all labs ordered are listed, but only abnormal results are displayed) Labs Reviewed  COMPREHENSIVE METABOLIC PANEL - Abnormal; Notable for the following components:      Result Value   Glucose, Bld 100 (*)    All other components within normal limits  LIPASE, BLOOD  CBC  URINALYSIS, ROUTINE W REFLEX MICROSCOPIC    EKG EKG Interpretation  Date/Time:  Thursday September 05 2021 14:33:12 EDT Ventricular Rate:  79 PR Interval:  146 QRS Duration: 100 QT Interval:  388 QTC Calculation: 444 R Axis:   3 Text Interpretation: Normal sinus rhythm Incomplete right bundle branch block Borderline ECG No old tracing to compare Confirmed by 09-10-1997 [phone]) on 09/05/2021 3:49:41 PM  Radiology No results found.  Procedures Procedures    Medications Ordered in ED Medications - No data to display  ED Course/ Medical Decision Making/ A&P                           Medical Decision Making Suzanne Garbers is a 46 year old male with a past medical history of GERD and traumatic brain injury with sequelae of migraines who presents  with 1 week of constipation with associated epigastric pain and 1 day of right upper quadrant, flank, low back, groin pain.  Work-up with CBC, BMP, EKG, and urinalysis were unremarkable and physical exam was reassuring for no acute abdominal process or need for further work-up at this time.  He has a gastroenterologist and has been told he may have irritable bowel syndrome the past.  We recommended that he follow-up with his primary care physician and gastroenterologist for further evaluation and management of his possible early bowel syndrome or for possible further evaluation of his right upper quadrant  pain if it recurs or does not resolve.  Patient was instructed to take Tylenol and MiraLAX for the pain and constipation while he waits for his follow-up with his primary care physician and gastroenterologist.  He is agreeable to plan and all questions were answered prior to discharge.  Problems Addressed: Abdominal pain, unspecified abdominal location: acute illness or injury  Amount and/or Complexity of Data Reviewed Labs: ordered. Decision-making details documented in ED Course. ECG/medicine tests: ordered and independent interpretation performed. Decision-making details documented in ED Course.           Final Clinical Impression(s) / ED Diagnoses Final diagnoses:  Abdominal pain, unspecified abdominal location    Rx / DC Orders ED Discharge Orders     None         Rocky Morel, DO 09/05/21 1627    Melene Plan, DO 09/05/21 1658

## 2021-09-05 NOTE — Discharge Instructions (Addendum)
You were seen in the emergency department for your upper abdominal pain with constipation and your new onset right upper quadrant, flank, back pain radiating into your groin.  Her evaluation did not show any signs of a urinary tract infection, blood in your urine that would signify a possible renal stone, any signs of any large infection, or electrolyte abnormalities that would be causing the these symptoms.  We recommend that you take MiraLAX to help relieve your constipation while you wait to follow-up with your primary care physician and gastroenterologist.  Keep your appointment with urology and ensure they evaluate your testicles due to the pain you described.  If you experience any new or worsening symptoms please return to the emergency room.

## 2021-09-05 NOTE — ED Triage Notes (Signed)
Pt POV c/o worsening abdominal cramping x3 days. Also c/o difficulty with urination, has appt with urology next week.   Denies diarrhea, denies emesis. Intermittent nausea. Denies known sick contacts or fevers.  Tolerating PO intake.   Also reports mid chest pain.

## 2021-09-05 NOTE — ED Notes (Signed)
Pt verbalizes understanding of discharge instructions. Opportunity for questions and answers were provided. Pt discharged from the ED.   ?

## 2021-09-19 ENCOUNTER — Institutional Professional Consult (permissible substitution): Payer: 59 | Admitting: Student

## 2021-09-30 NOTE — Progress Notes (Deleted)
Synopsis: Referred for abnormal CT Chest by Loyola Mast, NP  Subjective:   PATIENT ID: Devon Burch GENDER: male DOB: 1975/05/09, MRN: [id number]  No chief complaint on file.  46yM with history of GERD, welding, smoking  Otherwise pertinent review of systems is negative.  Past Medical History:  Diagnosis Date   Anxiety    GERD (gastroesophageal reflux disease)      Family History  Problem Relation Age of Onset   Emphysema Paternal Grandmother    Heart disease Paternal Grandfather    Heart disease Maternal Grandfather      Past Surgical History:  Procedure Laterality Date   HEMORRHOID SURGERY     KNEE SURGERY      Social History   Socioeconomic History   Marital status: Divorced    Spouse name: Not on file   Number of children: Not on file   Years of education: Not on file   Highest education level: Not on file  Occupational History   Not on file  Tobacco Use   Smoking status: Never    Passive exposure: Never   Smokeless tobacco: Not on file  Vaping Use   Vaping Use: Never used  Substance and Sexual Activity   Alcohol use: Yes   Drug use: No   Sexual activity: Not Currently  Other Topics Concern   Not on file  Social History Narrative   Not on file   Social Determinants of Health   Financial Resource Strain: Not on file  Food Insecurity: Not on file  Transportation Needs: Not on file  Physical Activity: Not on file  Stress: Not on file  Social Connections: Not on file  Intimate Partner Violence: Not on file     Allergies  Allergen Reactions   Sulfa Antibiotics Shortness Of Breath   Penicillins Other (See Comments)    Unknown reaction as a child     Outpatient Medications Prior to Visit  Medication Sig Dispense Refill   ALPRAZolam (XANAX PO) Take by mouth.     amitriptyline (ELAVIL) 50 MG tablet Take 75 mg by mouth at bedtime.     methylPREDNISolone (MEDROL DOSEPAK) 4 MG TBPK tablet 6 tabs day1, then 5 tabs day2, then 4 tabs day3,  then 3 tabs day4 and 2 tabs day5 and 1 tab day6 21 tablet 0   Multiple Vitamin (ONE-A-DAY MENS PO) Take 1 tablet by mouth daily.     pantoprazole (PROTONIX) 40 MG tablet Take 1 tablet (40 mg total) by mouth daily. Take 30-60 min before first meal of the day 30 tablet 2   propranolol (INDERAL) 20 MG tablet Take 1 tablet (20 mg total) by mouth 2 (two) times daily. 60 tablet 6   SUMAtriptan (IMITREX) 50 MG tablet Take 1 tablet (50 mg total) by mouth every 2 (two) hours as needed for migraine. May repeat in 2 hours if headache persists or recurs. 10 tablet 3   No facility-administered medications prior to visit.       Objective:   Physical Exam:  General appearance: 46 y.o., male, NAD, conversant  Eyes: anicteric sclerae; PERRL, tracking appropriately HENT: NCAT; MMM Neck: Trachea midline; no lymphadenopathy, no JVD Lungs: CTAB, no crackles, no wheeze, with normal respiratory effort CV: RRR, no murmur  Abdomen: Soft, non-tender; non-distended, BS present  Extremities: No peripheral edema, warm Skin: Normal turgor and texture; no rash Psych: Appropriate affect Neuro: Alert and oriented to person and place, no focal deficit     There were no vitals  filed for this visit.   on *** LPM *** RA BMI Readings from Last 3 Encounters:  09/05/21 26.79 kg/m  05/08/21 28.89 kg/m  04/30/21 27.44 kg/m   Wt Readings from Last 3 Encounters:  09/05/21 166 lb (75.3 kg)  05/08/21 179 lb (81.2 kg)  04/30/21 170 lb (77.1 kg)     CBC    Component Value Date/Time   WBC 7.9 09/05/2021 1446   RBC 5.00 09/05/2021 1446   HGB 14.8 09/05/2021 1446   HGB 15.0 01/22/2021 1554   HCT 44.3 09/05/2021 1446   HCT 41.8 01/22/2021 1554   PLT 313 09/05/2021 1446   PLT 289 1May 27, 2015 0540   MCV 88.6 09/05/2021 1446   MCV 86 01/22/2021 1554   MCV 90 1May 27, 2015 0540   MCH 29.6 09/05/2021 1446   MCHC 33.4 09/05/2021 1446   RDW 13.1 09/05/2021 1446   RDW 12.4 01/22/2021 1554   RDW 13.0 1May 27, 2015 0540    LYMPHSABS 2.4 04/30/2021 2318   LYMPHSABS 1.8 01/22/2021 1554   MONOABS 0.8 04/30/2021 2318   EOSABS 0.2 04/30/2021 2318   EOSABS 0.2 01/22/2021 1554   BASOSABS 0.1 04/30/2021 2318   BASOSABS 0.1 01/22/2021 1554    ***  Chest Imaging: CT Chest 09/13/21 with subtle micronodularity, stable 41mm RML nodule  Pulmonary Functions Testing Results:     No data to display          FeNO: ***  Pathology: ***  Echocardiogram: ***  Heart Catheterization: ***    Assessment & Plan:    Plan:      Omar Person, MD [facility] 09/30/2021 6:43 PM

## 2021-10-02 ENCOUNTER — Institutional Professional Consult (permissible substitution): Payer: 59 | Admitting: Student

## 2021-10-29 NOTE — Progress Notes (Unsigned)
Synopsis: Referred in September 2023 for recurrent pneumonia.  He had childhood asthma before joining the [employer] where he was sent to [place].  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  Subjective:   PATIENT ID: Devon Burch GENDER: male DOB: 1975/09/10, MRN: [id number]   HPI  Chief Complaint  Patient presents with   Consult    Referred for frequent PNA and bronchitis episode.     This is a pleasant 46 year old male who is a [place] [employer] veteran who comes to our clinic today for evaluation of recurrent bronchitis.  He says that as a child he had asthma and though he was athletically very active and ran track he required albuterol use quite frequently.  He "grew out of this" and then joined [employer].  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  He said after this he started having episodes of bronchitis and he had a severe form of pneumonia in 2007 which required a hospitalization for a week in [place].  While in [place] he never had burn pit exposure though he did have traumatic brain injury.  He says that he has never smoked cigarettes.  After honorable discharge from the [employer] he has worked as a Building control surveyor as a Designer, jewellery.  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  He says that when he is exposed to welding he tends to have more episodes of pneumonia and bronchitis.  His last episode of pneumonia was in 2014 where he was treated for "walking pneumonia" by [facility].  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  However, he still has a severe and  harsh cough whenever he gets a respiratory infection and he is here to see me today for further evaluation of underlying lung disease.  He uses albuterol as needed from time to time when he gets a cough and cold.  Of note, he was found to have a pulmonary nodule when he had a CT scan of his abdomen about a year ago.  This was 5 mm.  A repeat CT scan this year showed that it had not grown.  He says that he has severe gastroesophageal reflux disease and he takes pantoprazole twice a day for this.   He is workign in Architect sites frequently, but tries to avoid  Record review: August 2023 ER visit at [facility] health reviewed where the patient was seen for dyspepsia.  Discharged home, treated with MiraLAX and Tylenol.  Past Medical History:  Diagnosis Date   Anxiety    GERD (gastroesophageal reflux disease)      Family History  Problem Relation Age of Onset   Emphysema Paternal Grandmother    Heart disease Paternal Grandfather    Heart disease Maternal Grandfather      Social History   Socioeconomic History   Marital status: Divorced    Spouse name: Not on file   Number of children: Not on file   Years of education: Not on file   Highest education level: Not on file  Occupational History   Not on file  Tobacco Use   Smoking status: Never    Passive exposure: Never   Smokeless tobacco: Not on file  Vaping Use   Vaping Use: Never used  Substance and Sexual Activity   Alcohol use: Yes   Drug use: No   Sexual activity: Not Currently  Other Topics Concern   Not on file  Social History Narrative   Not on file   Social Determinants of Health   Financial Resource Strain: Not on file  Food Insecurity: Not on file  Transportation Needs: Not on file  Physical Activity: Not on file  Stress: Not on file  Social Connections: Not on file  Intimate Partner Violence: Not on file     Allergies  Allergen Reactions   Sulfa Antibiotics Shortness Of Breath   Penicillins Other (See  Comments)    Unknown reaction as a child     Outpatient Medications Prior to Visit  Medication Sig Dispense Refill   albuterol (VENTOLIN HFA) 108 (90 Base) MCG/ACT inhaler INHALE 2 PUFFS INTO THE LUNGS EVERY 6 HOURS AS NEEDED FOR WHEEZE     ALPRAZolam (XANAX PO) Take by mouth.     amitriptyline (ELAVIL) 10 MG tablet Take 10 mg by mouth at bedtime.     cetirizine (ZYRTEC) 10 MG tablet Take 10 mg by mouth daily.     pantoprazole (PROTONIX) 40 MG tablet Take 1 tablet (40 mg total) by mouth daily. Take 30-60 min before first meal of the day 30 tablet 2   SUMAtriptan (IMITREX) 50 MG tablet Take 1 tablet (50 mg total) by mouth every 2 (two) hours as needed for migraine. May repeat in 2 hours if headache persists or recurs. 10 tablet 3   amitriptyline (ELAVIL) 50 MG tablet Take 75 mg by mouth at bedtime.     methylPREDNISolone (MEDROL DOSEPAK) 4 MG TBPK tablet 6 tabs day1, then 5 tabs day2, then 4 tabs day3, then 3 tabs day4 and 2 tabs day5 and 1 tab day6 21 tablet 0   Multiple Vitamin (ONE-A-DAY MENS PO) Take 1 tablet by mouth daily.     propranolol (INDERAL) 20 MG tablet Take 1 tablet (20 mg total) by mouth 2 (two) times daily. 60 tablet 6   No facility-administered medications prior to visit.    Review of Systems  Constitutional:  Negative for chills, fever, malaise/fatigue and weight loss.  HENT:  Negative for congestion, nosebleeds, sinus pain and sore throat.   Eyes:  Negative for photophobia, pain and discharge.  Respiratory:  Positive for cough, shortness of breath and wheezing. Negative for hemoptysis and sputum production.   Cardiovascular:  Negative for chest pain, palpitations, orthopnea and leg swelling.  Gastrointestinal:  Negative for abdominal pain, constipation, diarrhea, nausea and vomiting.  Genitourinary:  Negative for dysuria, frequency, hematuria and urgency.  Musculoskeletal:  Negative for back pain, joint pain, myalgias and neck pain.  Skin:  Negative for itching and  rash.  Neurological:  Negative for tingling, tremors, sensory change, speech change, focal weakness, seizures, weakness and headaches.  Psychiatric/Behavioral:  Negative for memory loss, substance abuse and suicidal ideas. The patient is not nervous/anxious.       Objective:  Physical Exam   Vitals:   11/01/21 1624  BP: 116/68  Pulse: 78  SpO2: 98%  Weight: 174 lb 9.6 oz (79.2 kg)  Height: 5\' 6"  (1.676 m)    Gen: well appearing, no acute distress HENT: NCAT, OP clear, neck supple without masses  Eyes: PERRL, EOMi Lymph: no cervical lymphadenopathy PULM: CTA B CV: RRR, no mgr, no JVD GI: BS+, soft, nontender, no hsm Derm: no rash or skin breakdown MSK: normal bulk and tone Neuro: A&Ox4, CN II-XII intact, strength 5/5 in all 4 extremities Psyche: normal mood and affect   CBC    Component Value Date/Time   WBC 7.9 09/05/2021 1446   RBC 5.00 09/05/2021 1446   HGB 14.8 09/05/2021 1446   HGB 15.0 01/22/2021 1554   HCT 44.3 09/05/2021 1446   HCT 41.8 01/22/2021 1554   PLT 313 09/05/2021 1446   PLT 289 112-14-15 0540   MCV 88.6 09/05/2021 1446   MCV 86 01/22/2021 1554   MCV 90 112-14-15 0540   MCH 29.6 09/05/2021 1446   MCHC 33.4 09/05/2021 1446   RDW 13.1 09/05/2021 1446   RDW 12.4 01/22/2021 1554   RDW 13.0 112-14-15 0540   LYMPHSABS 2.4 04/30/2021 2318   LYMPHSABS 1.8 01/22/2021 1554   MONOABS 0.8 04/30/2021 2318   EOSABS 0.2 04/30/2021 2318   EOSABS 0.2 01/22/2021 1554   BASOSABS 0.1 04/30/2021 2318   BASOSABS 0.1 01/22/2021 1554     Chest imaging: 09/2021 CT chest Thoracic inlet/central airways: Thyroid normal. Airway patent.  Mediastinum/hila/axilla: No adenopathy.  Heart/vessels: Normal heart size. No pericardial effusion. Aorta normal in caliber.  Lungs/pleura: Bilateral pulmonary micronodules,  marked  n PACS on series 3. Similar size of the previously described right middle lobe nodule (series 3 image 175), measuring 5 mm.  Upper abdomen: Similar 2 cm  low-attenuation lesion in the lateral spleen, may represent splenic cyst versus hemangioma.  no acute findings in the imaged upper abdomen.  Chest wall/MSK: No acute osseous abnormalities.  Images personally reviewed  PFT:  FENO: 11/01/2021 22 ppb  Labs:  Path:  Echo:  Heart Catheterization: CT angio coronary 07/23/2020  Impression: 1. Normal coronary arteries without evidence of atherosclerotic disease. 2. CAD-RADS 0. 3. Evaluate for non-cardiac etiology of chest pain. 4. Extracardiac findings: See radiology report.        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  Fortunately, on my personal review of the CT scan of his chest from a month ago I do not see evidence of underlying structural lung disease.  Specifically there is no bronchiectasis or emphysema.  He has a history of asthma as a kid, so I explained to him that this may be the cause of his recurrent respiratory difficulties with viral illnesses.  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  Immunizations: Immunization History  Administered Date(s) Administered   Influenza Split 12/12/2011     Current Outpatient Medications:    albuterol (VENTOLIN HFA) 108 (90 Base) MCG/ACT inhaler, INHALE 2 PUFFS INTO THE LUNGS EVERY 6 HOURS AS NEEDED FOR WHEEZE, Disp: , Rfl:    ALPRAZolam (XANAX PO), Take by mouth., Disp: , Rfl:    amitriptyline (ELAVIL) 10 MG tablet, Take 10 mg by mouth at bedtime., Disp: , Rfl:    cetirizine (ZYRTEC) 10 MG tablet, Take 10 mg by mouth daily., Disp: , Rfl:    pantoprazole (PROTONIX) 40 MG tablet, Take 1 tablet (40 mg total) by mouth daily. Take 30-60 min before first meal of the day, Disp: 30 tablet, Rfl: 2   SUMAtriptan (IMITREX) 50 MG tablet, Take 1 tablet (50 mg total) by mouth every 2 (two) hours as needed for migraine. May repeat in 2 hours if headache persists or recurs., Disp: 10 tablet, Rfl: 3

## 2021-11-01 ENCOUNTER — Ambulatory Visit (INDEPENDENT_AMBULATORY_CARE_PROVIDER_SITE_OTHER): Payer: Commercial Managed Care - PPO | Admitting: Pulmonary Disease

## 2021-11-01 ENCOUNTER — Encounter: Payer: Self-pay | Admitting: Pulmonary Disease

## 2021-11-01 VITALS — BP 116/68 | HR 78 | Ht 66.0 in | Wt 174.6 lb

## 2021-11-01 DIAGNOSIS — J189 Pneumonia, unspecified organism: Secondary | ICD-10-CM

## 2021-11-01 DIAGNOSIS — J4520 Mild intermittent asthma, uncomplicated: Secondary | ICD-10-CM | POA: Diagnosis not present

## 2021-11-01 DIAGNOSIS — R911 Solitary pulmonary nodule: Secondary | ICD-10-CM | POA: Diagnosis not present

## 2021-11-01 NOTE — Patient Instructions (Signed)
For recurrent bronchitis and pneumonia: We will check serum immunoglobulins, complement levels  For history of mild intermittent asthma: Lung function testing Exhaled nitric oxide testing CBC with differential Serum IgE Keep using albuterol as needed for chest tightness wheezing or shortness of breath  For 5 mm pulmonary nodule seen on the CT scan of his chest this year: Repeat CT scan August 2024 without contrast  Shortness of breath: Lung function testing as detailed above  Recurrent bronchitis in setting of welding: I agree with your decision to avoid welding, if you need a letter from me to support this for your employer I am happy to help  We will see you back in 4 to 6 weeks to go over these results or sooner if needed.

## 2021-11-11 ENCOUNTER — Ambulatory Visit: Payer: Commercial Managed Care - PPO | Admitting: Neurology

## 2021-12-19 ENCOUNTER — Ambulatory Visit: Payer: 59 | Admitting: Pulmonary Disease

## 2021-12-19 NOTE — Progress Notes (Deleted)
Synopsis: Referred in September 2023 for recurrent pneumonia.  He had childhood asthma before joining the [employer] where he was sent to [place].  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  He has noted increased bronchitis symptoms when welding frequently.  Subjective:   PATIENT ID: Devon Burch GENDER: male DOB: [birth year], MRN: [id number]   HPI  No chief complaint on file.  ***  Past Medical History:  Diagnosis Date   Anxiety    GERD (gastroesophageal reflux disease)     ROS    Objective:  Physical Exam   There were no vitals filed for this visit.   ***   CBC    Component Value Date/Time   WBC 7.9 09/05/2021 1446   RBC 5.00 09/05/2021 1446   HGB 14.8 09/05/2021 1446   HGB 15.0 01/22/2021 1554   HCT 44.3 09/05/2021 1446   HCT 41.8 01/22/2021 1554   PLT 313 09/05/2021 1446   PLT 289 106-12-15 0540   MCV 88.6 09/05/2021 1446   MCV 86 01/22/2021 1554   MCV 90 106-12-15 0540   MCH 29.6 09/05/2021 1446   MCHC 33.4 09/05/2021 1446   RDW 13.1 09/05/2021 1446   RDW 12.4 01/22/2021 1554   RDW 13.0 106-12-15 0540   LYMPHSABS 2.4 04/30/2021 2318   LYMPHSABS 1.8 01/22/2021 1554   MONOABS 0.8 04/30/2021 2318   EOSABS 0.2 04/30/2021 2318   EOSABS 0.2 01/22/2021 1554   BASOSABS 0.1 04/30/2021 2318   BASOSABS 0.1 01/22/2021 1554     Chest imaging: 09/2021 CT chest Thoracic inlet/central airways: Thyroid normal. Airway patent.  Mediastinum/hila/axilla: No adenopathy.  Heart/vessels: Normal heart size. No pericardial effusion. Aorta normal in caliber.  Lungs/pleura: Bilateral pulmonary micronodules,  marked  n PACS on series 3. Similar size of the previously described right middle lobe nodule (series 3 image 175), measuring 5 mm.  Upper abdomen: Similar 2 cm low-attenuation lesion in the  lateral spleen, may represent splenic cyst versus hemangioma.  no acute findings in the imaged upper abdomen.  Chest wall/MSK: No acute osseous abnormalities.  Images personally reviewed  PFT:  FENO: 11/01/2021 22 ppb  Labs:  Path:  Echo:  Heart Catheterization: CT angio coronary 07/23/2020  Impression: 1. Normal coronary arteries without evidence of atherosclerotic disease. 2. CAD-RADS 0. 3. Evaluate for non-cardiac etiology of chest pain. 4. Extracardiac findings: See radiology report.        Assessment & Plan:   No diagnosis found.  Discussion: ***  Immunizations: Immunization History  Administered Date(s) Administered   Influenza Split 12/12/2011     Current Outpatient Medications:    albuterol (VENTOLIN HFA) 108 (90 Base) MCG/ACT inhaler, INHALE 2 PUFFS INTO THE LUNGS EVERY 6 HOURS AS NEEDED FOR WHEEZE, Disp: , Rfl:    ALPRAZolam (XANAX PO), Take by mouth., Disp: , Rfl:    amitriptyline (ELAVIL) 10 MG tablet, Take 10 mg by mouth at bedtime., Disp: , Rfl:    cetirizine (ZYRTEC) 10 MG tablet, Take 10 mg by mouth daily., Disp: , Rfl:    pantoprazole (PROTONIX) 40 MG tablet, Take 1 tablet (40 mg total) by mouth daily. Take 30-60 min before first meal of the day, Disp: 30 tablet, Rfl: 2   SUMAtriptan (IMITREX) 50 MG tablet, Take 1 tablet (50 mg total) by mouth every  2 (two) hours as needed for migraine. May repeat in 2 hours if headache persists or recurs., Disp: 10 tablet, Rfl: 3

## 2022-01-17 DIAGNOSIS — K7581 Nonalcoholic steatohepatitis (NASH): Secondary | ICD-10-CM | POA: Insufficient documentation

## 2022-02-04 ENCOUNTER — Ambulatory Visit: Payer: Commercial Managed Care - PPO | Admitting: Neurology

## 2022-02-25 ENCOUNTER — Ambulatory Visit (INDEPENDENT_AMBULATORY_CARE_PROVIDER_SITE_OTHER): Payer: Commercial Managed Care - PPO | Admitting: Pulmonary Disease

## 2022-02-25 ENCOUNTER — Encounter: Payer: Self-pay | Admitting: Pulmonary Disease

## 2022-02-25 VITALS — BP 130/80 | HR 73 | Temp 98.3°F | Ht 66.0 in | Wt 164.0 lb

## 2022-02-25 DIAGNOSIS — R0600 Dyspnea, unspecified: Secondary | ICD-10-CM

## 2022-02-25 DIAGNOSIS — R911 Solitary pulmonary nodule: Secondary | ICD-10-CM

## 2022-02-25 DIAGNOSIS — R400 Somnolence: Secondary | ICD-10-CM | POA: Diagnosis not present

## 2022-02-25 NOTE — Patient Instructions (Signed)
Snoring, daytime somnolence and fatigue: In lab sleep study  Pulmonary nodules with associated chest pain and shortness of breath: High-resolution CT scan of the chest to assess further Lung function testing  Recurrent pneumonia with shortness of breath and abnormal chest imaging: Workup as above with high-resolution CT scan of the chest and lung function testing  Follow-up in 4 to 6 weeks or sooner if needed.

## 2022-02-25 NOTE — Progress Notes (Signed)
Synopsis: Referred in September 2023 for recurrent pneumonia.  He had childhood asthma before joining the [employer] where he was sent to [place].  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  Subjective:   PATIENT ID: Devon Burch GENDER: male DOB: November 09, 1975, MRN: [id number]   HPI  Chief Complaint  Patient presents with   Follow-up    High left lung pain, sob, pt is a welder     He has pain in left lung when he takes a deep breath > worse with a deep breath > the pain comes and goes > it's sharp > it's hard to distinguish between his lungs and his esophagus   He feels like he can't breathe when he is sitting up in a chair > This has been going on for a year or more > He will doze off to sleep and then suddenly feel that he is gasping for air, only occurs when he sitting in a chair > No associated cough. > Sometimes associated with a feeling "that there is fluid in my lungs"  He snores, has daytime somnolence and fatigue. Follow-up falls asleep easily after work and dozes off prior to bed Has been tested for sleep apnea in the past with a home sleep study and said that he did not have sleep apnea   Past Medical History:  Diagnosis Date   Anxiety    GERD (gastroesophageal reflux disease)      Review of Systems  Constitutional:  Positive for malaise/fatigue. Negative for chills, fever and weight loss.  HENT:  Negative for congestion, sinus pain and sore throat.   Respiratory:  Positive for shortness of breath. Negative for cough and sputum production.   Cardiovascular:  Negative for chest pain and leg swelling.      Objective:  Physical Exam   Vitals:   02/25/22 1618  BP: 130/80  Pulse: 73  Temp: 98.3 F (36.8 C)   TempSrc: Oral  SpO2: 99%  Weight: 164 lb (74.4 kg)  Height: 5\' 6"  (1.676 m)    Gen: well appearing HENT: OP clear, neck supple PULM: CTA B, normal effort  CV: RRR, no mgr GI: BS+, soft, nontender Derm: no cyanosis or rash Psyche: normal mood and affect    CBC    Component Value Date/Time   WBC 7.9 09/05/2021 1446   RBC 5.00 09/05/2021 1446   HGB 14.8 09/05/2021 1446   HGB 15.0 01/22/2021 1554   HCT 44.3 09/05/2021 1446   HCT 41.8 01/22/2021 1554   PLT 313 09/05/2021 1446   PLT 289 1May 16, 2015 0540   MCV 88.6 09/05/2021 1446   MCV 86 01/22/2021 1554   MCV 90 1May 16, 2015 0540   MCH 29.6 09/05/2021 1446   MCHC 33.4 09/05/2021 1446   RDW 13.1 09/05/2021 1446   RDW 12.4 01/22/2021 1554   RDW 13.0 1May 16, 2015 0540   LYMPHSABS 2.4 04/30/2021 2318   LYMPHSABS 1.8 01/22/2021 1554   MONOABS 0.8 04/30/2021 2318   EOSABS 0.2 04/30/2021 2318   EOSABS 0.2 01/22/2021 1554   BASOSABS 0.1 04/30/2021 2318   BASOSABS 0.1 01/22/2021 1554     Chest imaging: 09/2021 CT chest Thoracic inlet/central airways: Thyroid normal. Airway patent.  Mediastinum/hila/axilla: No adenopathy.  Heart/vessels: Normal  heart size. No pericardial effusion. Aorta normal in caliber.  Lungs/pleura: Bilateral pulmonary micronodules,  marked  n PACS on series 3. Similar size of the previously described right middle lobe nodule (series 3 image 175), measuring 5 mm.  Upper abdomen: Similar 2 cm low-attenuation lesion in the lateral spleen, may represent splenic cyst versus hemangioma.  no acute findings in the imaged upper abdomen.  Chest wall/MSK: No acute osseous abnormalities.  Images personally reviewed  PFT:  FENO: 11/01/2021 22 ppb  Labs:  Path:  Echo:  Heart Catheterization: CT angio coronary 07/23/2020  Impression: 1. Normal coronary arteries without evidence of atherosclerotic disease. 2. CAD-RADS 0. 3. Evaluate for non-cardiac etiology of chest pain. 4. Extracardiac findings: See radiology report.         Assessment & Plan:   Pulmonary nodule - Plan: CT Chest High Resolution  Dyspnea, unspecified type - Plan: Pulmonary function test  Daytime sleepiness - Plan: Split night study  Discussion: Man returns to clinic today complaining of pain when he breathes, some shortness of breath when he sleeping.  Unfortunately he did not complete the workup we recommended after the last visit so we need to go ahead and try to get that now.  The differential diagnosis includes asthma versus occupational related induced bronchospasm.  I also worry about the possibility of sleep apnea given the fact that he snores and he is waking up gasping for air.  Snoring, daytime somnolence and fatigue: In lab sleep study  Pulmonary nodules with associated chest pain and shortness of breath: High-resolution CT scan of the chest to assess further Lung function testing  Recurrent pneumonia with shortness of breath and abnormal chest imaging: Workup as above with high-resolution CT scan of the chest and lung function testing  Follow-up in 4 to 6 weeks or sooner if needed.  Immunizations: Immunization History  Administered Date(s) Administered   Influenza Split 12/12/2011     Current Outpatient Medications:    albuterol (VENTOLIN HFA) 108 (90 Base) MCG/ACT inhaler, INHALE 2 PUFFS INTO THE LUNGS EVERY 6 HOURS AS NEEDED FOR WHEEZE, Disp: , Rfl:    ALPRAZolam (XANAX PO), Take by mouth., Disp: , Rfl:    amitriptyline (ELAVIL) 10 MG tablet, Take 10 mg by mouth at bedtime., Disp: , Rfl:    cetirizine (ZYRTEC) 10 MG tablet, Take 10 mg by mouth daily., Disp: , Rfl:    pantoprazole (PROTONIX) 40 MG tablet, Take 1 tablet (40 mg total) by mouth daily. Take 30-60 min before first meal of the day, Disp: 30 tablet, Rfl: 2   SUMAtriptan (IMITREX) 50 MG tablet, Take 1 tablet (50 mg total) by mouth every 2 (two) hours as needed for migraine. May repeat in 2 hours if headache persists or recurs., Disp: 10 tablet, Rfl:  3

## 2022-03-06 ENCOUNTER — Ambulatory Visit: Payer: Commercial Managed Care - PPO | Admitting: Neurology

## 2022-03-06 ENCOUNTER — Encounter: Payer: Self-pay | Admitting: Neurology

## 2022-03-19 ENCOUNTER — Ambulatory Visit (HOSPITAL_BASED_OUTPATIENT_CLINIC_OR_DEPARTMENT_OTHER): Payer: Commercial Managed Care - PPO

## 2022-03-22 ENCOUNTER — Ambulatory Visit (HOSPITAL_BASED_OUTPATIENT_CLINIC_OR_DEPARTMENT_OTHER)
Admission: RE | Admit: 2022-03-22 | Discharge: 2022-03-22 | Disposition: A | Discharge status: Home / Self Care | Payer: Commercial Managed Care - PPO | Source: Ambulatory Visit | Attending: Pulmonary Disease | Admitting: Pulmonary Disease

## 2022-03-22 DIAGNOSIS — R911 Solitary pulmonary nodule: Secondary | ICD-10-CM | POA: Insufficient documentation

## 2022-03-25 ENCOUNTER — Ambulatory Visit: Payer: Commercial Managed Care - PPO | Admitting: Pulmonary Disease

## 2022-04-01 ENCOUNTER — Encounter: Payer: Self-pay | Admitting: Pulmonary Disease

## 2022-04-01 ENCOUNTER — Ambulatory Visit (INDEPENDENT_AMBULATORY_CARE_PROVIDER_SITE_OTHER): Payer: Commercial Managed Care - PPO | Admitting: Pulmonary Disease

## 2022-04-01 VITALS — BP 118/80 | HR 71 | Temp 97.8°F | Ht 66.0 in | Wt 162.6 lb

## 2022-04-01 DIAGNOSIS — J4520 Mild intermittent asthma, uncomplicated: Secondary | ICD-10-CM

## 2022-04-01 DIAGNOSIS — R911 Solitary pulmonary nodule: Secondary | ICD-10-CM

## 2022-04-01 NOTE — Patient Instructions (Signed)
Pulmonary nodule: We will repeat a CT scan of the chest in August 2025  Gastroesophageal reflux disease: Continue antiacid Follow-up with GI  Mild intermittent asthma: Albuterol as needed for chest tightness wheezing or shortness of breath  Will call you with the results of the CT scan in 2025, if you have any respiratory problems please do not hesitate to call us back

## 2022-04-01 NOTE — Progress Notes (Signed)
Synopsis: Referred in September 2023 for recurrent pneumonia.  He had childhood asthma before joining the [employer] where he was sent to [place].  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  Subjective:   PATIENT ID: Devon Burch GENDER: male DOB: [birth year], MRN: [id number]   HPI  No chief complaint on file.   Devon Burch says that his symptoms are about the same since the last visit.  He had COVID a couple weeks ago and he still had some left upper anterior chest pain from time to time.  Some shortness of breath from time to time.  No cough, no mucus production.  He is here to follow-up the results from the CT scan of his chest.  He notes some significant esophageal pain recently with heartburn.   Past Medical History:  Diagnosis Date   Anxiety    GERD (gastroesophageal reflux disease)      Review of Systems  Constitutional:  Positive for malaise/fatigue. Negative for chills, fever and weight loss.  HENT:  Negative for congestion, sinus pain and sore throat.   Respiratory:  Positive for shortness of breath. Negative for cough and sputum production.   Cardiovascular:  Negative for chest pain and leg swelling.      Objective:  Physical Exam   Vitals:   04/01/22 1627  BP: 118/80  Pulse: 71  Temp: 97.8 F (36.6 C)  TempSrc: Oral  SpO2: 98%  Weight: 162 lb 9.6 oz (73.8 kg)  Height: 5' 6"$  (1.676 m)    Gen: well appearing HENT: OP clear, neck supple PULM: CTA B, normal effort  CV: RRR, no mgr GI: BS+, soft, nontender Derm: no cyanosis or rash Psyche: normal mood and affect     CBC    Component Value Date/Time   WBC 7.9 09/05/2021 1446   RBC 5.00 09/05/2021 1446   HGB 14.8 09/05/2021 1446   HGB 15.0 01/22/2021 1554    HCT 44.3 09/05/2021 1446   HCT 41.8 01/22/2021 1554   PLT 313 09/05/2021 1446   PLT 289 12/31/2013 0540   MCV 88.6 09/05/2021 1446   MCV 86 01/22/2021 1554   MCV 90 12/31/2013 0540   MCH 29.6 09/05/2021 1446   MCHC 33.4 09/05/2021 1446   RDW 13.1 09/05/2021 1446   RDW 12.4 01/22/2021 1554   RDW 13.0 12/31/2013 0540   LYMPHSABS 2.4 04/30/2021 2318   LYMPHSABS 1.8 01/22/2021 1554   MONOABS 0.8 04/30/2021 2318   EOSABS 0.2 04/30/2021 2318   EOSABS 0.2 01/22/2021 1554   BASOSABS 0.1 04/30/2021 2318   BASOSABS 0.1 01/22/2021 1554     Chest imaging: 09/2021 CT chest Thoracic inlet/central airways: Thyroid normal. Airway patent.  Mediastinum/hila/axilla: No adenopathy.  Heart/vessels: Normal heart size. No pericardial effusion. Aorta normal in caliber.  Lungs/pleura: Bilateral pulmonary micronodules,  marked  n PACS on series 3. Similar size of the previously described right middle lobe nodule (series 3 image 175), measuring 5 mm.  Upper abdomen: Similar 2 cm low-attenuation lesion in the lateral spleen, may represent splenic cyst versus hemangioma.  no acute findings in the imaged upper abdomen.  Chest wall/MSK: No acute osseous abnormalities.  Images personally reviewed February 2024 CT chest images independently reviewed showing small pulmonary nodules bilaterally, no  bigger than 3 mm in size, no findings of ILD  PFT:  FENO: 11/01/2021 22 ppb  Labs:  Path:  Echo:  Heart Catheterization: CT angio coronary 07/23/2020  Impression: 1. Normal coronary arteries without evidence of atherosclerotic disease. 2. CAD-RADS 0. 3. Evaluate for non-cardiac etiology of chest pain. 4. Extracardiac findings: See radiology report.        Assessment & Plan:   Pulmonary nodule - Plan: CT Chest Wo Contrast  Mild intermittent asthma without complication  Discussion: Devon Burch returns to clinic today for evaluation of chest pain and intermittent shortness of breath.  In general the dyspnea has improved  but the chest pain persist.  He wonders if it is related to his esophagus.  There is nothing on the CT scan of his chest which would explain the chest pain.  Because of his exposure history I think it is reasonable to follow the pulmonary nodules to completion, 2 years from the original discovery (August 2023).  At this time however, I see no evidence of an underlying lung problem other than perhaps mild intermittent asthma.  Plan: Pulmonary nodule: We will repeat a CT scan of the chest in August 2025  Gastroesophageal reflux disease: Continue antiacid Follow-up with GI  Mild intermittent asthma: Albuterol as needed for chest tightness wheezing or shortness of breath  Will call you with the results of the CT scan in 2025, if you have any respiratory problems please do not hesitate to call us back  Immunizations: Immunization History  Administered Date(s) Administered   Influenza Split 12/12/2011     Current Outpatient Medications:    albuterol (VENTOLIN HFA) 108 (90 Base) MCG/ACT inhaler, INHALE 2 PUFFS INTO THE LUNGS EVERY 6 HOURS AS NEEDED FOR WHEEZE, Disp: , Rfl:    ALPRAZolam (XANAX PO), Take by mouth., Disp: , Rfl:    ALPRAZolam (XANAX) 0.25 MG tablet, Take 0.25 mg by mouth at bedtime as needed., Disp: , Rfl:    cetirizine (ZYRTEC) 10 MG tablet, Take 10 mg by mouth daily., Disp: , Rfl:    pantoprazole (PROTONIX) 40 MG tablet, Take 1 tablet (40 mg total) by mouth daily. Take 30-60 min before first meal of the day, Disp: 30 tablet, Rfl: 2   SUMAtriptan (IMITREX) 50 MG tablet, Take 1 tablet (50 mg total) by mouth every 2 (two) hours as needed for migraine. May repeat in 2 hours if headache persists or recurs., Disp: 10 tablet, Rfl: 3   amitriptyline (ELAVIL) 10 MG tablet, Take 10 mg by mouth at bedtime. (Patient not taking: Reported on 04/01/2022), Disp: , Rfl:

## 2022-04-08 DIAGNOSIS — R1013 Epigastric pain: Secondary | ICD-10-CM | POA: Insufficient documentation

## 2022-04-13 ENCOUNTER — Emergency Department (HOSPITAL_BASED_OUTPATIENT_CLINIC_OR_DEPARTMENT_OTHER): Payer: Commercial Managed Care - PPO

## 2022-04-13 ENCOUNTER — Other Ambulatory Visit: Payer: Self-pay

## 2022-04-13 ENCOUNTER — Encounter (HOSPITAL_BASED_OUTPATIENT_CLINIC_OR_DEPARTMENT_OTHER): Payer: Self-pay | Admitting: Urology

## 2022-04-13 ENCOUNTER — Emergency Department (HOSPITAL_BASED_OUTPATIENT_CLINIC_OR_DEPARTMENT_OTHER)
Admission: EM | Admit: 2022-04-13 | Discharge: 2022-04-13 | Disposition: A | Discharge status: Home / Self Care | Payer: Commercial Managed Care - PPO | Attending: Emergency Medicine | Admitting: Emergency Medicine

## 2022-04-13 DIAGNOSIS — R1084 Generalized abdominal pain: Secondary | ICD-10-CM | POA: Diagnosis not present

## 2022-04-13 LAB — COMPREHENSIVE METABOLIC PANEL
ALT: 37 U/L (ref 0–44)
AST: 27 U/L (ref 15–41)
Albumin: 4.6 g/dL (ref 3.5–5.0)
Alkaline Phosphatase: 78 U/L (ref 38–126)
Anion gap: 5 (ref 5–15)
BUN: 13 mg/dL (ref 6–20)
CO2: 28 mmol/L (ref 22–32)
Calcium: 9.1 mg/dL (ref 8.9–10.3)
Chloride: 104 mmol/L (ref 98–111)
Creatinine, Ser: 1.02 mg/dL (ref 0.61–1.24)
GFR, Estimated: >60 mL/min (ref >60)
Glucose, Bld: 104 mg/dL — ABNORMAL HIGH (ref 70–99)
Potassium: 4.1 mmol/L (ref 3.5–5.1)
Sodium: 137 mmol/L (ref 135–145)
Total Bilirubin: 0.9 mg/dL (ref 0.3–1.2)
Total Protein: 7.6 g/dL (ref 6.5–8.1)

## 2022-04-13 LAB — CBC
HCT: 45 % (ref 39.0–52.0)
Hemoglobin: 15.3 g/dL (ref 13.0–17.0)
MCH: 29.3 pg (ref 26.0–34.0)
MCHC: 34 g/dL (ref 30.0–36.0)
MCV: 86 fL (ref 80.0–100.0)
Platelets: 290 10*3/uL (ref 150–400)
RBC: 5.23 MIL/uL (ref 4.22–5.81)
RDW: 13.2 % (ref 11.5–15.5)
WBC: 5.6 10*3/uL (ref 4.0–10.5)
nRBC: 0 % (ref 0.0–0.2)

## 2022-04-13 LAB — URINALYSIS, ROUTINE W REFLEX MICROSCOPIC
Bilirubin Urine: NEGATIVE
Glucose, UA: NEGATIVE mg/dL
Hgb urine dipstick: NEGATIVE
Ketones, ur: NEGATIVE mg/dL
Leukocytes,Ua: NEGATIVE
Nitrite: NEGATIVE
Protein, ur: NEGATIVE mg/dL
Specific Gravity, Urine: 1.015 (ref 1.005–1.030)
pH: 7.5 (ref 5.0–8.0)

## 2022-04-13 LAB — LIPASE, BLOOD: Lipase: 26 U/L (ref 11–51)

## 2022-04-13 MED ORDER — IOHEXOL 300 MG/ML  SOLN
100.0000 mL | Freq: Once | INTRAMUSCULAR | Status: AC | PRN
  Administered 2022-04-13: 100 mL via INTRAVENOUS

## 2022-04-13 MED ORDER — ALUM & MAG HYDROXIDE-SIMETH 200-200-20 MG/5ML PO SUSP
30.0000 mL | Freq: Once | ORAL | Status: AC
  Administered 2022-04-13: 30 mL via ORAL
  Filled 2022-04-13: qty 30

## 2022-04-13 MED ORDER — FLEET ENEMA 7-19 GM/118ML RE ENEM: 1.0000 | ENEMA | Freq: Once | RECTAL | 0 refills | Status: AC

## 2022-04-13 MED ORDER — LIDOCAINE VISCOUS HCL 2 % MT SOLN
15.0000 mL | Freq: Once | OROMUCOSAL | Status: AC
  Administered 2022-04-13: 15 mL via ORAL
  Filled 2022-04-13: qty 15

## 2022-04-13 NOTE — ED Notes (Signed)
Discharge paperwork reviewed entirely with patient, including Rx's and follow up care. Pain was under control. Pt verbalized understanding as well as all parties involved. No questions or concerns voiced at the time of discharge. No acute distress noted.   Pt ambulated out to PVA without incident or assistance.

## 2022-04-13 NOTE — ED Provider Notes (Cosign Needed Addendum)
[facility] Provider Note   CSN: [id number] Arrival date & time: 04/13/22  [id number]     History  Chief Complaint  Patient presents with   Abdominal Pain    Devon Burch is a 47 y.o. male, history of GERD, who presents to the ED secondary to intermittent diarrhea, constipation, has been going on for the last couple weeks, that is associated with severe bloating, pencillike stools, and severe abdominal pain that is diffuse from the epigastric to the suprapubic area.  Denies any chest pain, shortness of breath.  Pain is dull and aching in nature.  Not associated with nausea, vomiting, diarrhea.  Denies any rectal bleeding.     Home Medications Prior to Admission medications   Medication Sig Start Date End Date Taking? Authorizing Provider  sodium phosphate (FLEET) 7-19 GM/118ML ENEM Place 133 mLs (1 enema total) rectally once for 1 dose. 04/13/22 04/13/22 Yes Able Malloy L, PA  albuterol (VENTOLIN HFA) 108 (90 Base) MCG/ACT inhaler INHALE 2 PUFFS INTO THE LUNGS EVERY 6 HOURS AS NEEDED FOR WHEEZE 01/05/19   [provider]  ALPRAZolam (XANAX PO) Take by mouth.    [provider]  ALPRAZolam Duanne Moron) 0.25 MG tablet Take 0.25 mg by mouth at bedtime as needed.    [provider]  amitriptyline (ELAVIL) 10 MG tablet Take 10 mg by mouth at bedtime. Patient not taking: Reported on 04/01/2022    [provider]  cetirizine (ZYRTEC) 10 MG tablet Take 10 mg by mouth daily. 08/30/21   [provider]  pantoprazole (PROTONIX) 40 MG tablet Take 1 tablet (40 mg total) by mouth daily. Take 30-60 min before first meal of the day 05/25/12   Tanda Rockers, MD  SUMAtriptan (IMITREX) 50 MG tablet Take 1 tablet (50 mg total) by mouth every 2 (two) hours as needed for migraine. May repeat in 2 hours if headache persists or recurs. 05/08/21   Alric Ran, MD      Allergies    Sulfa antibiotics and Penicillins    Review  of Systems   Review of Systems  Gastrointestinal:  Positive for abdominal distention, abdominal pain, constipation and diarrhea. Negative for nausea.    Physical Exam Updated Vital Signs BP (!) 120/91 (BP Location: Right Arm)   Pulse 64   Temp 98.5 F (36.9 C) (Oral)   Resp 18   Ht '5\' 6"'$  (1.676 m)   Wt 73.8 kg   SpO2 98%   BMI 26.24 kg/m  Physical Exam Vitals and nursing note reviewed.  Constitutional:      General: He is not in acute distress.    Appearance: He is well-developed.  HENT:     Head: Normocephalic and atraumatic.  Eyes:     Conjunctiva/sclera: Conjunctivae normal.  Cardiovascular:     Rate and Rhythm: Normal rate and regular rhythm.     Heart sounds: No murmur heard. Pulmonary:     Effort: Pulmonary effort is normal. No respiratory distress.     Breath sounds: Normal breath sounds.  Abdominal:     General: There is distension.     Palpations: Abdomen is soft.     Tenderness: There is generalized abdominal tenderness.  Musculoskeletal:        General: No swelling.     Cervical back: Neck supple.  Skin:    General: Skin is warm and dry.     Capillary Refill: Capillary refill takes less than 2 seconds.  Neurological:  Mental Status: He is alert.  Psychiatric:        Mood and Affect: Mood normal.     ED Results / Procedures / Treatments   Labs (all labs ordered are listed, but only abnormal results are displayed) Labs Reviewed  COMPREHENSIVE METABOLIC PANEL - Abnormal; Notable for the following components:      Result Value   Glucose, Bld 104 (*)    All other components within normal limits  LIPASE, BLOOD  CBC  URINALYSIS, ROUTINE W REFLEX MICROSCOPIC    EKG None  Radiology CT ABDOMEN PELVIS W CONTRAST  Result Date: 04/13/2022 CLINICAL DATA:  Intermittent diarrhea constipation. Cramping for 2 weeks EXAM: CT ABDOMEN AND PELVIS WITH CONTRAST TECHNIQUE: Multidetector CT imaging of the abdomen and pelvis was performed using the standard  protocol following bolus administration of intravenous contrast. RADIATION DOSE REDUCTION: This exam was performed according to the departmental dose-optimization program which includes automated exposure control, adjustment of the mA and/or kV according to patient size and/or use of iterative reconstruction technique. CONTRAST:  141m OMNIPAQUE IOHEXOL 300 MG/ML  SOLN COMPARISON:  CT 09/13/2021 chest. Older abdomen and pelvis CT scans. FINDINGS: Lower chest: 3 mm nodule in the middle lobe on series 3, image 6 is stable going back 2017 demonstrating long-term stability. No additional imaging follow-up. There is some basilar dependent atelectasis. No pleural effusion. Hepatobiliary: No space-occupying liver lesion. Patent portal vein. Gallbladder is nondilated. Pancreas: Unremarkable. No pancreatic ductal dilatation or surrounding inflammatory changes. Spleen: There is a Jabbar Palmero cystic lesion along the spleen anterolateral. Today on series 2, image 26 this measured 15 by 8 mm. There is a Rachel Rison lesion in this location on the study of August 2017. Slow interval growth. No specific imaging follow-up. Adrenals/Urinary Tract: Adrenal glands are preserved. No enhancing renal mass or collecting system dilatation. Peripheral exophytic midportion left-sided renal cysts identified measuring 11 mm in maximal dimension. Hounsfield units of close to 0. Bosniak 1 lesion. No additional imaging follow-up. The ureters have normal course and caliber extending down to the bladder. The bladder has a preserved contour. Stomach/Bowel: There is moderate diffuse colonic stool. Large bowel has a normal course and caliber. Normal appendix. Chadric Kimberley hiatal hernia. The stomach is nondilated. Jill Ruppe bowel is nondilated. Vascular/Lymphatic: Normal caliber aorta and IVC. No definite retroperitoneal lymph node enlargement. There are some prominent mesenteric nodes seen particularly in the right lower quadrant. These are not pathologic by size criteria  but slightly more numerous than usually seen. These were however identified on the study of 2017. Reproductive: Heterogeneous prostate.  Also unchanged from previous. Other: Loi Rennaker bilateral fat containing inguinal hernias. Daleah Coulson umbilical fat containing hernia as well. Musculoskeletal: No acute or significant osseous findings. IMPRESSION: Diffuse colonic stool. No bowel obstruction, free air or free fluid. Normal appendix. 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 No specific imaging follow-up. Electronically Signed   By: AJill SideM.D.   On: 04/13/2022 11:31    Procedures Procedures    Medications Ordered in ED Medications  alum & mag hydroxide-simeth (MAALOX/MYLANTA) 200-200-20 MG/5ML suspension 30 mL (30 mLs Oral Given 04/13/22 1026)    And  lidocaine (XYLOCAINE) 2 % viscous mouth solution 15 mL (15 mLs Oral Given 04/13/22 1026)  iohexol (OMNIPAQUE) 300 MG/ML solution 100 mL (100 mLs Intravenous Contrast Given 04/13/22 1047)    ED Course/ Medical Decision Making/ A&P  Medical Decision Making Patient is a 47 year old male, here for diffuse abdominal pain has been going on for the last couple weeks, associated diarrhea and constipation that has been intermittent.  Recently got off amitriptyline.  He has diffuse abdominal pain without guarding, however is distended, we will obtain labs and CT abdomen pelvis get secondary to distention and pencillike stools.  He states that he typically gets a colonoscopy every 3 years secondary to his polyps.  Amount and/or Complexity of Data Reviewed Labs: ordered.    Details: Unremarkable Radiology: ordered.    Details: CT abdomen pelvis shows diffuse stool, and splenic cystic lesion that has been gradually growing. Discussion of management or test interpretation with external provider(s): Discussed with patient, pain is likely secondary to IBS, he states that he has had a history of this, and  stopped taking his amitriptyline, couple weeks ago and this started.  I believe that his amitriptyline may have been helping with his IBS, and alleviating his symptoms, we discussed cleaning himself out, with the bowel prep, and he voiced understanding.  We also discussed possible symptoms secondary to uncontrolled acid right reflux given the epigastric discomfort, and to increase his pantoprazole to twice a day.  Return precautions emphasized.  His pain is diffuse and spastic in nature, and with associated diarrhea and constipation this is likely secondary to IBS, does not sound coronary in nature. We discussed splenic cyst and f/u with PCP as well as repeat colonoscopy this year.  Risk OTC drugs. Prescription drug management.    Final Clinical Impression(s) / ED Diagnoses Final diagnoses:  Generalized abdominal pain    Rx / DC Orders ED Discharge Orders          Ordered    sodium phosphate (FLEET) 7-19 GM/118ML ENEM   Once        04/13/22 1156              Jaelani Posa Carlean Jews, PA 04/13/22 1159    Armida Vickroy, [place], [place] 04/13/22 1159    Charlesetta Shanks, MD 04/16/22 1807

## 2022-04-13 NOTE — ED Notes (Signed)
Pt aware of need for urine sample. Unable to go at present time

## 2022-04-13 NOTE — ED Triage Notes (Signed)
Pt states abdominal cramping x 2 weeks, Denies any N/V, states intermittent constipation and diarrhea Took dicyclomine last night with no relief

## 2022-04-13 NOTE — Discharge Instructions (Addendum)
Please follow-up with your primary care doctor and GI doctor.  If you have worsening abdominal pain please return to the ER.  You can double your pantoprazole to twice a day, and then also I recommend doing MiraLAX twice a day for the next 7 days, or you can do bowel prep, to make a bowel prep you will empty whole container of 238g of MiraLAX in to 32 ounces bottles of Gatorade.  I believe that your pain may be secondary to IBS versus uncontrolled GERD, follow-up with your GI doctor.

## 2022-05-26 ENCOUNTER — Encounter: Payer: Self-pay | Admitting: *Deleted

## 2022-06-10 IMAGING — CR DG LUMBAR SPINE 2-3V
3 series · 3 of 3 positions shown · non-contrast
Comparison: None.

CLINICAL DATA: Low back pain. Acute bilateral low back pain without
sciatica.

EXAM:
LUMBAR SPINE - 2-3 VIEW

[t l-spine a.p.]
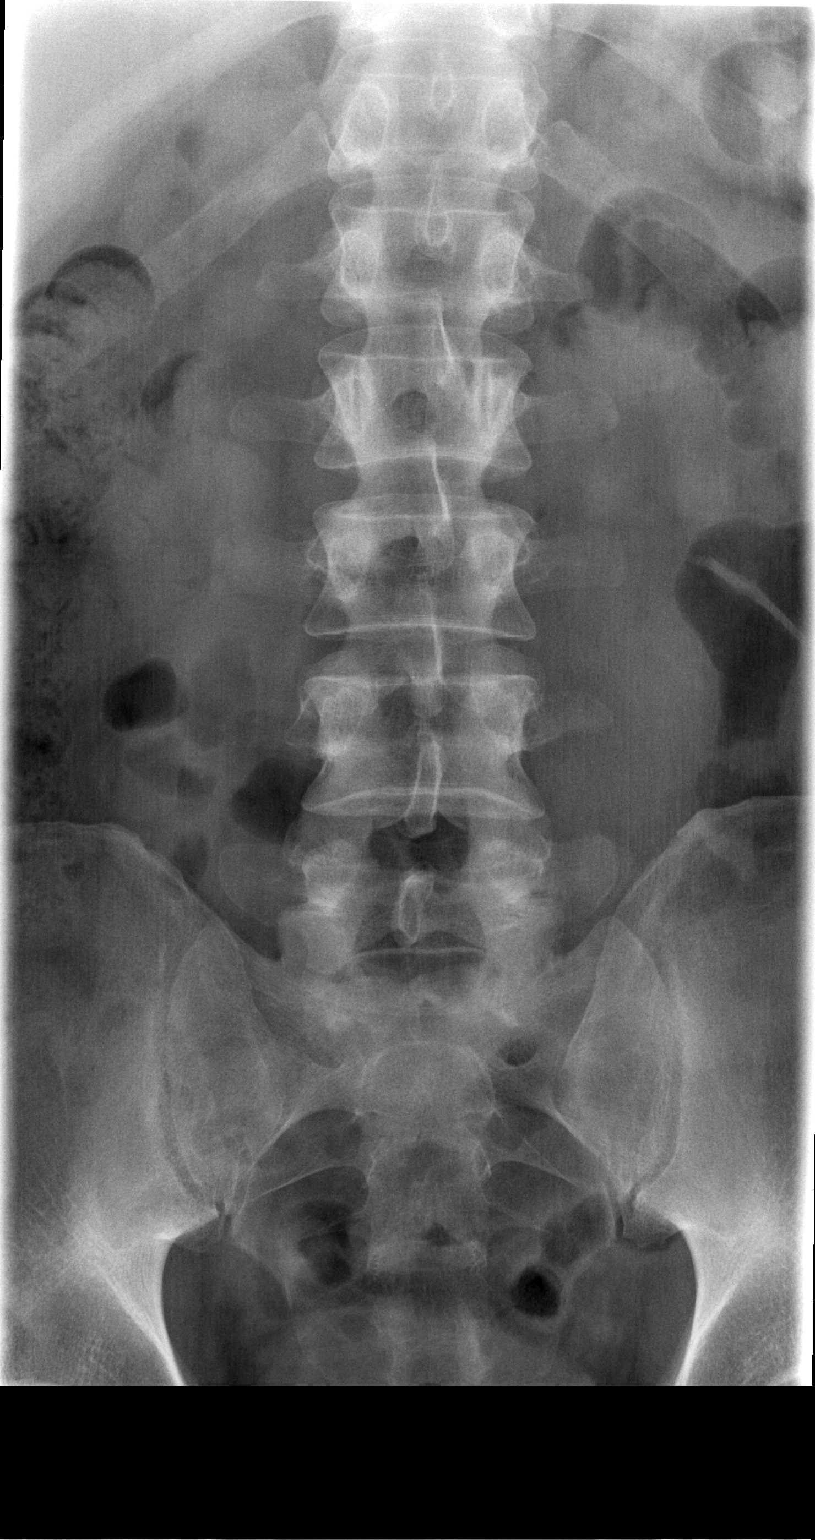

[t l-spine lat]
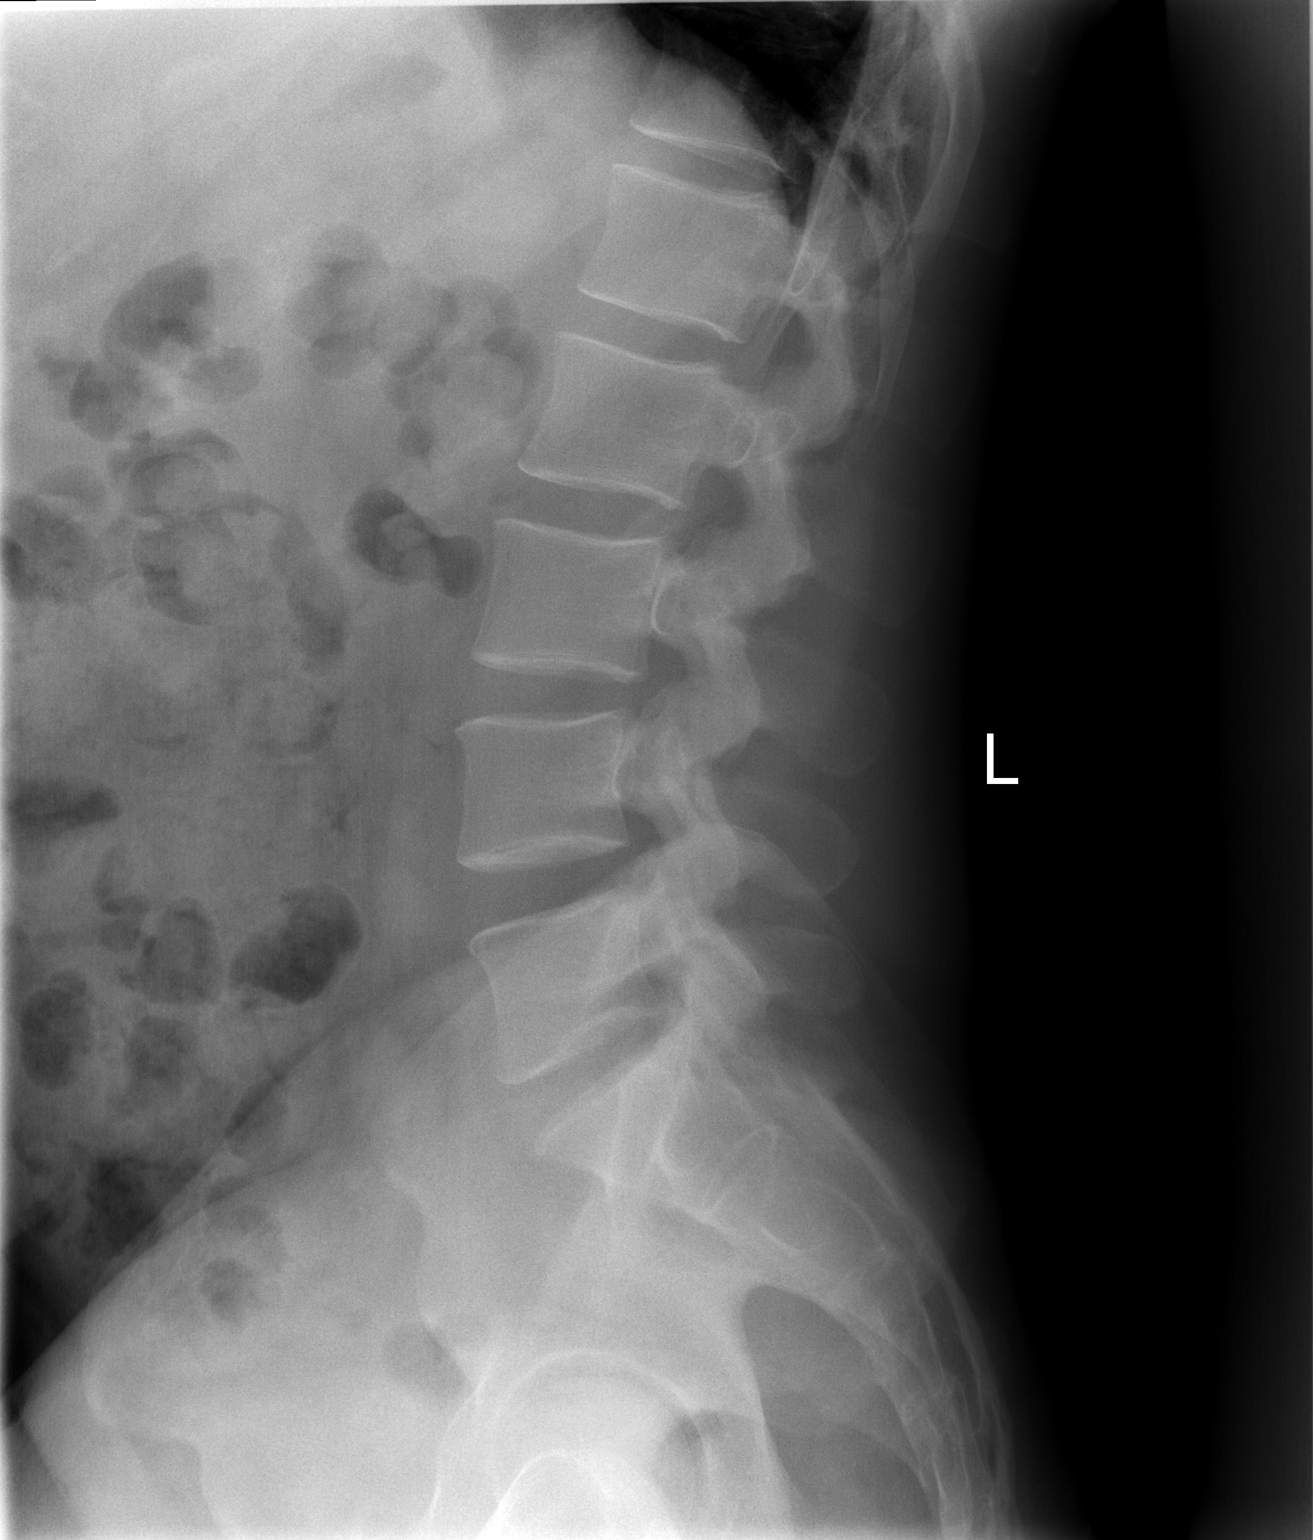

[t l-spine l5-s1 spot]
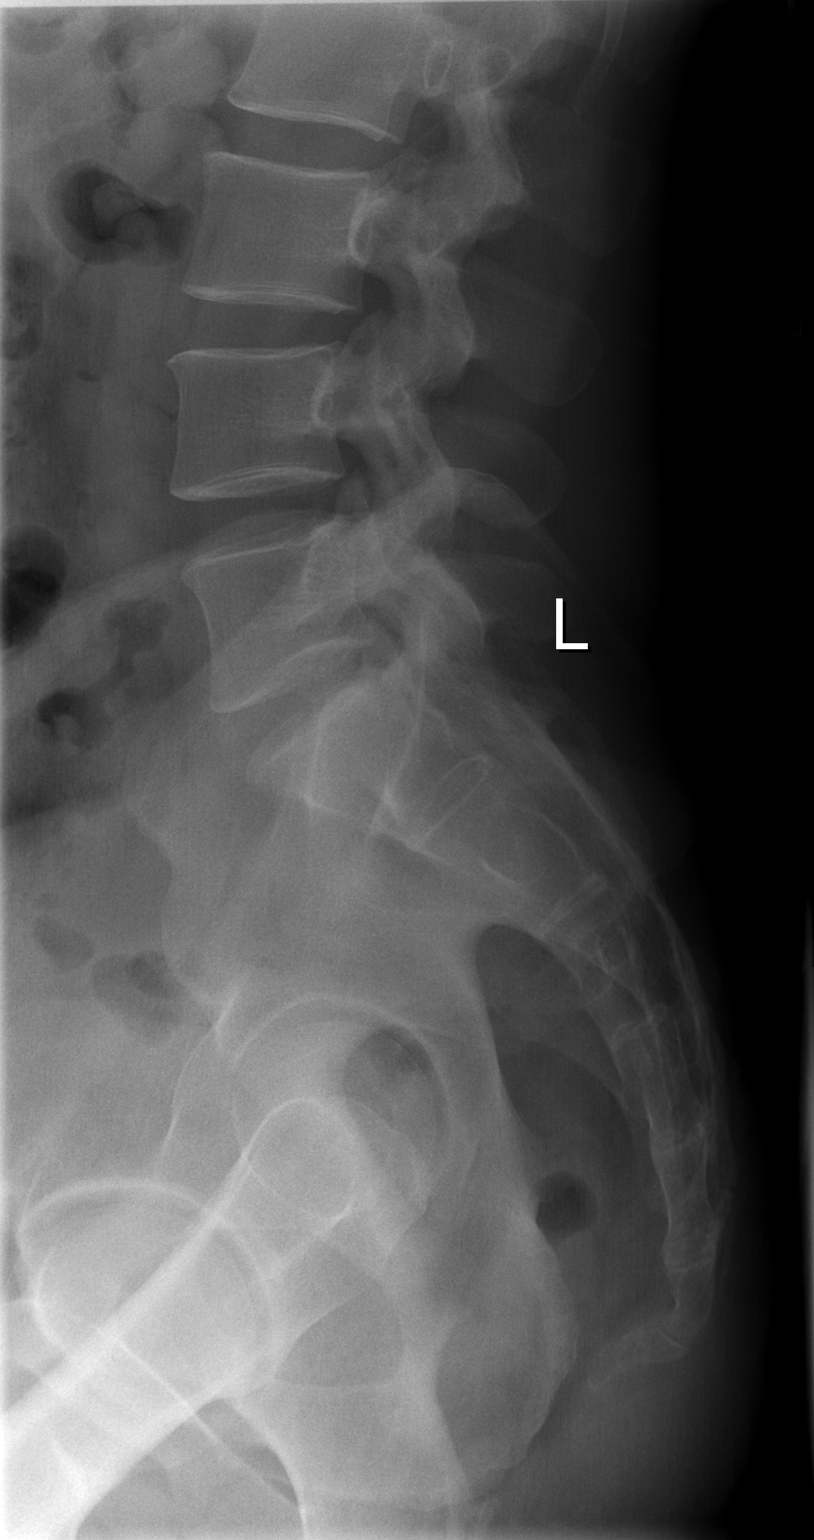

[3 of 3 positions shown; findings below may reference images not displayed]

FINDINGS: Five lumbar type vertebra. The alignment is maintained. Vertebral
body heights are normal. There is no listhesis. The posterior
elements are intact. Disc spaces are preserved. No fracture,
evidence of pars defects, focal bone lesion or bone destruction.
Sacroiliac joints are symmetric and normal.
IMPRESSION: Negative radiographs of the lumbar spine.

## 2022-09-18 ENCOUNTER — Other Ambulatory Visit: Payer: 59

## 2022-10-12 ENCOUNTER — Emergency Department (HOSPITAL_BASED_OUTPATIENT_CLINIC_OR_DEPARTMENT_OTHER)
Admission: EM | Admit: 2022-10-12 | Discharge: 2022-10-12 | Disposition: A | Discharge status: Home / Self Care | Payer: Commercial Managed Care - PPO | Attending: Emergency Medicine | Admitting: Emergency Medicine

## 2022-10-12 ENCOUNTER — Encounter (HOSPITAL_BASED_OUTPATIENT_CLINIC_OR_DEPARTMENT_OTHER): Payer: Self-pay | Admitting: Emergency Medicine

## 2022-10-12 DIAGNOSIS — R1012 Left upper quadrant pain: Secondary | ICD-10-CM | POA: Diagnosis present

## 2022-10-12 DIAGNOSIS — K2900 Acute gastritis without bleeding: Secondary | ICD-10-CM | POA: Diagnosis not present

## 2022-10-12 LAB — CBC WITH DIFFERENTIAL/PLATELET
Abs Immature Granulocytes: 0.02 10*3/uL (ref 0.00–0.07)
Basophils Absolute: 0 10*3/uL (ref 0.0–0.1)
Basophils Relative: 1 %
Eosinophils Absolute: 0.3 10*3/uL (ref 0.0–0.5)
Eosinophils Relative: 4 %
HCT: 42.5 % (ref 39.0–52.0)
Hemoglobin: 14.9 g/dL (ref 13.0–17.0)
Immature Granulocytes: 0 %
Lymphocytes Relative: 21 %
Lymphs Abs: 1.4 10*3/uL (ref 0.7–4.0)
MCH: 30.1 pg (ref 26.0–34.0)
MCHC: 35.1 g/dL (ref 30.0–36.0)
MCV: 85.9 fL (ref 80.0–100.0)
Monocytes Absolute: 0.6 10*3/uL (ref 0.1–1.0)
Monocytes Relative: 9 %
Neutro Abs: 4.4 10*3/uL (ref 1.7–7.7)
Neutrophils Relative %: 65 %
Platelets: 275 10*3/uL (ref 150–400)
RBC: 4.95 MIL/uL (ref 4.22–5.81)
RDW: 13.2 % (ref 11.5–15.5)
WBC: 6.8 10*3/uL (ref 4.0–10.5)
nRBC: 0 % (ref 0.0–0.2)

## 2022-10-12 LAB — COMPREHENSIVE METABOLIC PANEL
ALT: 72 U/L — ABNORMAL HIGH (ref 0–44)
AST: 38 U/L (ref 15–41)
Albumin: 4.7 g/dL (ref 3.5–5.0)
Alkaline Phosphatase: 88 U/L (ref 38–126)
Anion gap: 9 (ref 5–15)
BUN: 11 mg/dL (ref 6–20)
CO2: 26 mmol/L (ref 22–32)
Calcium: 9.2 mg/dL (ref 8.9–10.3)
Chloride: 103 mmol/L (ref 98–111)
Creatinine, Ser: 1.01 mg/dL (ref 0.61–1.24)
GFR, Estimated: >60 mL/min (ref >60)
Glucose, Bld: 92 mg/dL (ref 70–99)
Potassium: 3.9 mmol/L (ref 3.5–5.1)
Sodium: 138 mmol/L (ref 135–145)
Total Bilirubin: 1.5 mg/dL — ABNORMAL HIGH (ref 0.3–1.2)
Total Protein: 7.6 g/dL (ref 6.5–8.1)

## 2022-10-12 LAB — LIPASE, BLOOD: Lipase: 23 U/L (ref 11–51)

## 2022-10-12 MED ORDER — ALUM & MAG HYDROXIDE-SIMETH 200-200-20 MG/5ML PO SUSP
30.0000 mL | Freq: Once | ORAL | Status: AC
  Administered 2022-10-12: 30 mL via ORAL
  Filled 2022-10-12: qty 30

## 2022-10-12 MED ORDER — ACETAMINOPHEN 500 MG PO TABS
1000.0000 mg | ORAL_TABLET | Freq: Once | ORAL | Status: AC
  Administered 2022-10-12: 1000 mg via ORAL
  Filled 2022-10-12: qty 2

## 2022-10-12 MED ORDER — LIDOCAINE VISCOUS HCL 2 % MT SOLN
15.0000 mL | Freq: Once | OROMUCOSAL | Status: AC
  Administered 2022-10-12: 15 mL via ORAL
  Filled 2022-10-12: qty 15

## 2022-10-12 MED ORDER — ONDANSETRON 4 MG PO TBDP
8.0000 mg | ORAL_TABLET | Freq: Once | ORAL | Status: AC
  Administered 2022-10-12: 8 mg via ORAL
  Filled 2022-10-12: qty 2

## 2022-10-12 MED ORDER — SUCRALFATE 1 GM/10ML PO SUSP
1.0000 g | Freq: Once | ORAL | Status: AC
  Administered 2022-10-12: 1 g via ORAL
  Filled 2022-10-12: qty 10

## 2022-10-12 NOTE — ED Triage Notes (Signed)
Pt c/o LUQ pain x 3 wks; +nausea; also wants insect bite to face checked

## 2022-10-12 NOTE — ED Provider Notes (Signed)
[facility] Provider Note  CSN: [id number] Arrival date & time: 10/12/22 1105  Chief Complaint(s) Abdominal Pain  HPI Devon Burch is a 47 y.o. male history of GERD presenting to the emergency department with abdominal pain.  Patient reports pain in the left upper quadrant.  Reports that is always there, mild nausea.  Has been taking ibuprofen does not help.  Still taking his pantoprazole.  No vomiting.  No diarrhea or black stools.  No fevers or chills.  No urinary symptoms.  Patient also has a bug bite on his chin, wanted to get checked,.  He had tried to squeeze it earlier because he thought it might be a pimple but just made it worse.   Past Medical History Past Medical History:  Diagnosis Date   Anxiety    GERD (gastroesophageal reflux disease)    Patient Active Problem List   Diagnosis Date Noted   Chronic idiopathic gout involving toe of right foot without tophus 01/22/2021   Patellar tendinitis of left knee 01/16/2021   Acute bilateral low back pain without sciatica 09/10/2020   Cough 05/28/2012   Hemoptysis 05/28/2012   Home Medication(s) Prior to Admission medications   Medication Sig Start Date End Date Taking? Authorizing Provider  albuterol (VENTOLIN HFA) 108 (90 Base) MCG/ACT inhaler INHALE 2 PUFFS INTO THE LUNGS EVERY 6 HOURS AS NEEDED FOR WHEEZE 01/05/19   [provider]  ALPRAZolam (XANAX PO) Take by mouth.    [provider]  ALPRAZolam Prudy Feeler) 0.25 MG tablet Take 0.25 mg by mouth at bedtime as needed.    [provider]  amitriptyline (ELAVIL) 10 MG tablet Take 10 mg by mouth at bedtime. Patient not taking: Reported on 04/01/2022    [provider]  cetirizine (ZYRTEC) 10 MG tablet Take 10 mg by mouth daily. 08/30/21   [provider]  pantoprazole (PROTONIX) 40 MG tablet Take 1 tablet (40 mg total) by mouth daily. Take 30-60 min before first meal of the day 05/25/12    Nyoka Cowden, MD  SUMAtriptan (IMITREX) 50 MG tablet Take 1 tablet (50 mg total) by mouth every 2 (two) hours as needed for migraine. May repeat in 2 hours if headache persists or recurs. 05/08/21   Windell Norfolk, MD                                                                                                                                    Past Surgical History Past Surgical History:  Procedure Laterality Date   HEMORRHOID SURGERY     KNEE SURGERY     Family History Family History  Problem Relation Age of Onset   Emphysema Paternal Grandmother    Heart disease Paternal Grandfather    Heart disease Maternal Grandfather     Social History Social History   Tobacco Use   Smoking status: Never    Passive exposure:  Never  Vaping Use   Vaping status: Never Used  Substance Use Topics   Alcohol use: Yes   Drug use: No   Allergies Sulfa antibiotics and Penicillins  Review of Systems Review of Systems  All other systems reviewed and are negative.   Physical Exam Vital Signs  I have reviewed the triage vital signs BP (!) 140/96   Pulse 79   Temp 98 F (36.7 C)   Resp 14   Ht 5\' 6"  (1.676 m)   Wt 73.9 kg   SpO2 98%   BMI 26.31 kg/m  Physical Exam Vitals and nursing note reviewed.  Constitutional:      General: He is not in acute distress.    Appearance: Normal appearance.  HENT:     Head:     Comments: Very small scab underneath the chin without surrounding erythema or warmth, no fluctuance or purulence    Mouth/Throat:     Mouth: Mucous membranes are moist.  Eyes:     Conjunctiva/sclera: Conjunctivae normal.  Cardiovascular:     Rate and Rhythm: Normal rate and regular rhythm.  Pulmonary:     Effort: Pulmonary effort is normal. No respiratory distress.     Breath sounds: Normal breath sounds.  Abdominal:     General: Abdomen is flat.     Palpations: Abdomen is soft.     Tenderness: There is no abdominal tenderness.  Musculoskeletal:     Right lower  leg: No edema.     Left lower leg: No edema.  Skin:    General: Skin is warm and dry.     Capillary Refill: Capillary refill takes less than 2 seconds.  Neurological:     Mental Status: He is alert and oriented to person, place, and time. Mental status is at baseline.  Psychiatric:        Mood and Affect: Mood normal.        Behavior: Behavior normal.     ED Results and Treatments Labs (all labs ordered are listed, but only abnormal results are displayed) Labs Reviewed  COMPREHENSIVE METABOLIC PANEL - Abnormal; Notable for the following components:      Result Value   ALT 72 (*)    Total Bilirubin 1.5 (*)    All other components within normal limits  CBC WITH DIFFERENTIAL/PLATELET  LIPASE, BLOOD                                                                                                                          Radiology No results found.  Pertinent labs & imaging results that were available during my care of the patient were reviewed by me and considered in my medical decision making (see MDM for details).  Medications Ordered in ED Medications  alum & mag hydroxide-simeth (MAALOX/MYLANTA) 200-200-20 MG/5ML suspension 30 mL (30 mLs Oral Given 10/12/22 1153)    And  lidocaine (XYLOCAINE) 2 % viscous mouth solution 15 mL (15 mLs Oral Given 10/12/22 1153)  sucralfate (CARAFATE) 1 GM/10ML suspension 1 g (1 g Oral Given 10/12/22 1153)  ondansetron (ZOFRAN-ODT) disintegrating tablet 8 mg (8 mg Oral Given 10/12/22 1154)  acetaminophen (TYLENOL) tablet 1,000 mg (1,000 mg Oral Given 10/12/22 1215)                                                                                                                                     Procedures Procedures  (including critical care time)  Medical Decision Making / ED Course   MDM:  47 year old male presenting to the emergency department with left upper quadrant pain.  Patient well-appearing, physical exam with no abdominal tenderness.   Patient also requested wound check of bug bite.  Appears to have a scab on it without signs of infection.  Discussed with patient  Suspect abdominal pain is likely stomach related, likely gastritis.  Will trial GI cocktail and reassess.  Patient reports taking ibuprofen which would only worsen his symptoms.  Denies significant alcohol use.  Lower concern for other cause such as pancreatitis without significant abdominal tenderness, radiation to the back will check lipase.  Doubt hepatic pathology given pain on left side will check CMP.  Will reassess.  Doubt any cardiac issue but will check ECG.  Clinical Course as of 10/12/22 1304  Sun Oct 12, 2022  1302 Patient feels better.  Labs with very mild LFT abnormality.  Discussed with the patient.  The x-rays follow-up with GI soon.  Suspect gastritis.  He reports that he takes ibuprofen almost daily for headaches.  He has a history of TBI.  Advised trial of Tylenol instead of his this is probably related to his pain.  Discussed Maalox for symptoms at home. Will discharge patient to home. All questions answered. Patient comfortable with plan of discharge. Return precautions discussed with patient and specified on the after visit summary.  [WS]    Clinical Course User Index [WS] Lonell Grandchild, MD      Lab Tests: -I ordered, reviewed, and interpreted labs.   The pertinent results include:   Labs Reviewed  COMPREHENSIVE METABOLIC PANEL - Abnormal; Notable for the following components:      Result Value   ALT 72 (*)    Total Bilirubin 1.5 (*)    All other components within normal limits  CBC WITH DIFFERENTIAL/PLATELET  LIPASE, BLOOD    Notable for nonspecific borderline LFT abnormalities. Normal hgb and lipase  EKG   EKG Interpretation Date/Time:  Sunday October 12 2022 12:11:25 EDT Ventricular Rate:  73 PR Interval:  147 QRS Duration:  112 QT Interval:  401 QTC Calculation: 442 R Axis:   36  Text Interpretation: Sinus rhythm  Borderline intraventricular conduction delay RSR' in V1 or V2, right VCD or RVH Confirmed by Alvino Blood ([id number]) on 10/12/2022 12:32:34 PM         Medicines ordered and prescription drug management: Meds ordered this encounter  Medications  AND Linked Order Group    alum & mag hydroxide-simeth (MAALOX/MYLANTA) 200-200-20 MG/5ML suspension 30 mL    lidocaine (XYLOCAINE) 2 % viscous mouth solution 15 mL   sucralfate (CARAFATE) 1 GM/10ML suspension 1 g   ondansetron (ZOFRAN-ODT) disintegrating tablet 8 mg   acetaminophen (TYLENOL) tablet 1,000 mg    -I have reviewed the patients home medicines and have made adjustments as needed    Reevaluation: After the interventions noted above, I reevaluated the patient and found that their symptoms have improved  Co morbidities that complicate the patient evaluation  Past Medical History:  Diagnosis Date   Anxiety    GERD (gastroesophageal reflux disease)       Dispostion: Disposition decision including need for hospitalization was considered, and patient discharged from emergency department.    Final Clinical Impression(s) / ED Diagnoses Final diagnoses:  Acute gastritis without hemorrhage, unspecified gastritis type     This chart was dictated using voice recognition software.  Despite best efforts to proofread,  errors can occur which can change the documentation meaning.    Lonell Grandchild, MD 10/12/22 1304

## 2022-10-12 NOTE — Discharge Instructions (Signed)
We evaluated you for your abdominal pain.  Your symptoms are most likely related to inflammation in your stomach.  The most likely cause of this is ibuprofen use.  This medication can erode your stomach lining.  Please try Tylenol instead for your daily headaches.  You can buy Mylanta over-the-counter to take every 6 hours as needed for pain.  Please follow-up as scheduled with your GI doctor and discuss your symptoms with them.  I think your symptoms will get better with stopping the ibuprofen.  Please also follow-up with your neurologist to see if there are any other headache therapies they recommend.  Please return if you have any new or worsening symptoms such as severe worsening pain, fevers or chills, uncontrolled vomiting, bloody stools, black stools, or any other concerning symptoms.

## 2022-11-28 DIAGNOSIS — E7801 Familial hypercholesterolemia: Secondary | ICD-10-CM | POA: Insufficient documentation

## 2023-03-02 ENCOUNTER — Emergency Department (HOSPITAL_BASED_OUTPATIENT_CLINIC_OR_DEPARTMENT_OTHER): Payer: Commercial Managed Care - PPO

## 2023-03-02 ENCOUNTER — Emergency Department (HOSPITAL_BASED_OUTPATIENT_CLINIC_OR_DEPARTMENT_OTHER)
Admission: EM | Admit: 2023-03-02 | Discharge: 2023-03-02 | Disposition: A | Discharge status: Home / Self Care | Payer: Commercial Managed Care - PPO | Attending: Emergency Medicine | Admitting: Emergency Medicine

## 2023-03-02 ENCOUNTER — Encounter (HOSPITAL_BASED_OUTPATIENT_CLINIC_OR_DEPARTMENT_OTHER): Payer: Self-pay

## 2023-03-02 ENCOUNTER — Other Ambulatory Visit: Payer: Self-pay

## 2023-03-02 DIAGNOSIS — J101 Influenza due to other identified influenza virus with other respiratory manifestations: Secondary | ICD-10-CM | POA: Diagnosis not present

## 2023-03-02 DIAGNOSIS — R0602 Shortness of breath: Secondary | ICD-10-CM | POA: Diagnosis present

## 2023-03-02 DIAGNOSIS — J111 Influenza due to unidentified influenza virus with other respiratory manifestations: Secondary | ICD-10-CM

## 2023-03-02 DIAGNOSIS — I471 Supraventricular tachycardia, unspecified: Secondary | ICD-10-CM | POA: Insufficient documentation

## 2023-03-02 DIAGNOSIS — Z20822 Contact with and (suspected) exposure to covid-19: Secondary | ICD-10-CM | POA: Insufficient documentation

## 2023-03-02 LAB — I-STAT VENOUS BLOOD GAS, ED
Acid-base deficit: 2 mmol/L (ref 0.0–2.0)
Bicarbonate: 19.5 mmol/L — ABNORMAL LOW (ref 20.0–28.0)
Calcium, Ion: 1.01 mmol/L — ABNORMAL LOW (ref 1.15–1.40)
HCT: 43 % (ref 39.0–52.0)
Hemoglobin: 14.6 g/dL (ref 13.0–17.0)
O2 Saturation: 67 %
Patient temperature: 99.5
Potassium: 3.5 mmol/L (ref 3.5–5.1)
Sodium: 139 mmol/L (ref 135–145)
TCO2: 20 mmol/L — ABNORMAL LOW (ref 22–32)
pCO2, Ven: 24.8 mm[Hg] — ABNORMAL LOW (ref 44–60)
pH, Ven: 7.506 — ABNORMAL HIGH (ref 7.25–7.43)
pO2, Ven: 31 mm[Hg] — CL (ref 32–45)

## 2023-03-02 LAB — CBC WITH DIFFERENTIAL/PLATELET
Abs Immature Granulocytes: 0.11 10*3/uL — ABNORMAL HIGH (ref 0.00–0.07)
Basophils Absolute: 0 10*3/uL (ref 0.0–0.1)
Basophils Relative: 0 %
Eosinophils Absolute: 0.2 10*3/uL (ref 0.0–0.5)
Eosinophils Relative: 2 %
HCT: 44.1 % (ref 39.0–52.0)
Hemoglobin: 15.7 g/dL (ref 13.0–17.0)
Immature Granulocytes: 1 %
Lymphocytes Relative: 9 %
Lymphs Abs: 1.1 10*3/uL (ref 0.7–4.0)
MCH: 29.8 pg (ref 26.0–34.0)
MCHC: 35.6 g/dL (ref 30.0–36.0)
MCV: 83.7 fL (ref 80.0–100.0)
Monocytes Absolute: 1.3 10*3/uL — ABNORMAL HIGH (ref 0.1–1.0)
Monocytes Relative: 11 %
Neutro Abs: 8.9 10*3/uL — ABNORMAL HIGH (ref 1.7–7.7)
Neutrophils Relative %: 77 %
Platelets: 352 10*3/uL (ref 150–400)
RBC: 5.27 MIL/uL (ref 4.22–5.81)
RDW: 13 % (ref 11.5–15.5)
WBC: 11.5 10*3/uL — ABNORMAL HIGH (ref 4.0–10.5)
nRBC: 0 % (ref 0.0–0.2)

## 2023-03-02 LAB — LACTIC ACID, PLASMA
Lactic Acid, Venous: 4.6 mmol/L (ref 0.5–1.9)
Lactic Acid, Venous: 3.6 mmol/L (ref 0.5–1.9)

## 2023-03-02 LAB — RESP PANEL BY RT-PCR (RSV, FLU A&B, COVID)  RVPGX2
Influenza A by PCR: POSITIVE — AB
Influenza B by PCR: NEGATIVE
Resp Syncytial Virus by PCR: NEGATIVE
SARS Coronavirus 2 by RT PCR: NEGATIVE

## 2023-03-02 LAB — D-DIMER, QUANTITATIVE: D-Dimer, Quant: <0.27 ug{FEU}/mL (ref 0.00–0.50)

## 2023-03-02 LAB — TROPONIN I (HIGH SENSITIVITY)
Troponin I (High Sensitivity): 5 ng/L (ref <18)
Troponin I (High Sensitivity): 6 ng/L (ref <18)

## 2023-03-02 LAB — COMPREHENSIVE METABOLIC PANEL
ALT: 39 U/L (ref 0–44)
AST: 25 U/L (ref 15–41)
Albumin: 4.2 g/dL (ref 3.5–5.0)
Alkaline Phosphatase: 86 U/L (ref 38–126)
Anion gap: 13 (ref 5–15)
BUN: 14 mg/dL (ref 6–20)
CO2: 19 mmol/L — ABNORMAL LOW (ref 22–32)
Calcium: 9.8 mg/dL (ref 8.9–10.3)
Chloride: 104 mmol/L (ref 98–111)
Creatinine, Ser: 1 mg/dL (ref 0.61–1.24)
GFR, Estimated: >60 mL/min (ref >60)
Glucose, Bld: 128 mg/dL — ABNORMAL HIGH (ref 70–99)
Potassium: 3.3 mmol/L — ABNORMAL LOW (ref 3.5–5.1)
Sodium: 136 mmol/L (ref 135–145)
Total Bilirubin: 0.7 mg/dL (ref 0.0–1.2)
Total Protein: 7.3 g/dL (ref 6.5–8.1)

## 2023-03-02 LAB — BRAIN NATRIURETIC PEPTIDE: B Natriuretic Peptide: 13.9 pg/mL (ref 0.0–100.0)

## 2023-03-02 LAB — CBG MONITORING, ED
Glucose-Capillary: 122 mg/dL — ABNORMAL HIGH (ref 70–99)
Glucose-Capillary: 100 mg/dL — ABNORMAL HIGH (ref 70–99)

## 2023-03-02 MED ORDER — ADENOSINE 6 MG/2ML IV SOLN
6.0000 mg | Freq: Once | INTRAVENOUS | Status: AC
  Administered 2023-03-02: 6 mg via INTRAVENOUS
  Filled 2023-03-02: qty 2

## 2023-03-02 MED ORDER — ALBUTEROL SULFATE (2.5 MG/3ML) 0.083% IN NEBU
5.0000 mg | INHALATION_SOLUTION | Freq: Once | RESPIRATORY_TRACT | Status: AC
  Administered 2023-03-02: 5 mg via RESPIRATORY_TRACT
  Filled 2023-03-02: qty 6

## 2023-03-02 MED ORDER — IOHEXOL 350 MG/ML SOLN
75.0000 mL | Freq: Once | INTRAVENOUS | Status: AC | PRN
  Administered 2023-03-02: 75 mL via INTRAVENOUS

## 2023-03-02 MED ORDER — SODIUM CHLORIDE 0.9 % IV BOLUS
1000.0000 mL | Freq: Once | INTRAVENOUS | Status: AC
  Administered 2023-03-02: 1000 mL via INTRAVENOUS

## 2023-03-02 MED ORDER — ONDANSETRON HCL 4 MG/2ML IJ SOLN
4.0000 mg | Freq: Once | INTRAMUSCULAR | Status: DC
  Filled 2023-03-02: qty 2

## 2023-03-02 MED ORDER — METOPROLOL TARTRATE 5 MG/5ML IV SOLN
2.5000 mg | Freq: Once | INTRAVENOUS | Status: AC
  Administered 2023-03-02: 2.5 mg via INTRAVENOUS
  Filled 2023-03-02: qty 5

## 2023-03-02 MED ORDER — OSELTAMIVIR PHOSPHATE 75 MG PO CAPS: 75.0000 mg | ORAL_CAPSULE | Freq: Two times a day (BID) | ORAL | 0 refills | Status: DC

## 2023-03-02 MED ORDER — ACETAMINOPHEN 325 MG PO TABS
650.0000 mg | ORAL_TABLET | Freq: Once | ORAL | Status: AC
  Administered 2023-03-02: 650 mg via ORAL
  Filled 2023-03-02: qty 2

## 2023-03-02 MED ORDER — OSELTAMIVIR PHOSPHATE 75 MG PO CAPS
75.0000 mg | ORAL_CAPSULE | Freq: Once | ORAL | Status: AC
  Administered 2023-03-02: 75 mg via ORAL
  Filled 2023-03-02: qty 1

## 2023-03-02 MED ORDER — LORAZEPAM 1 MG PO TABS
1.0000 mg | ORAL_TABLET | Freq: Once | ORAL | Status: AC
  Administered 2023-03-02: 1 mg via ORAL
  Filled 2023-03-02: qty 1

## 2023-03-02 MED ORDER — KETOROLAC TROMETHAMINE 30 MG/ML IJ SOLN: 15.0000 mg | Freq: Once | INTRAMUSCULAR | Status: DC

## 2023-03-02 MED ORDER — LORAZEPAM 2 MG/ML IJ SOLN
1.0000 mg | Freq: Once | INTRAMUSCULAR | Status: AC
  Administered 2023-03-02: 0.5 mg via INTRAVENOUS
  Filled 2023-03-02: qty 1

## 2023-03-02 NOTE — ED Provider Notes (Signed)
Received the patient in signout with repeat lactate pending.  The plan is to reassess after lactic acid for ultimate disposition.  Physical Exam  BP 133/86   Pulse 100   Temp 99.3 F (37.4 C)   Resp 18   Wt 73.9 kg   SpO2 99%   BMI 26.31 kg/m   Physical Exam General: No acute distress  Procedures  Procedures  ED Course / MDM    Medical Decision Making Amount and/or Complexity of Data Reviewed Labs: ordered. Radiology: ordered.  Risk OTC drugs. Prescription drug management.   On reassessment the patient is complaining of a headache but states that he is otherwise feeling better.  The patient is mildly tachycardic here.  Will finish up the remainder of his IV fluids and check an ambulatory pulse ox for ultimate disposition.  I did discuss admission with the patient but he would rather go home.  His lactic acid has improved from 4.6-3.6.  He states that he is only had symptoms now for 2 days so suspicion for bacterial infection is relatively low at this time and I suspect this is all from the flu given the symptoms.  I will reassess for ultimate disposition but at this point the patient does want to be discharged and is requesting discharge.  On reassessment the patient is feeling well.  His ambulatory pulse ox was 98%.  He is mildly tachycardic persistently.  Given this finding as well as the issues with the SVT overnight and elevated lactate I did discuss admission again with the patient.  He states he is feeling better and would like to try to go home.  He is ultimately discharged through shared decision making which I do think is reasonable.       Durwin Glaze, MD 03/02/23 1011

## 2023-03-02 NOTE — ED Notes (Signed)
Pt ambulated in hallway without issue. SPO2 97-98% and HR 117-118.

## 2023-03-02 NOTE — ED Triage Notes (Signed)
Pt reports SHOB and dry cough since yesterday with increased SHOB tonight. Pt reports hx of pneumonia and has been around daughter who was sick.

## 2023-03-02 NOTE — Discharge Instructions (Signed)
You are positive for influenza.  No evidence of heart attack or blood clot in the lung.  No evidence of pneumonia.  Your CT scan did show small lung nodules which should be checked again in 1 year to make sure they are not changing in size.  Use Tylenol or Motrin as needed for aches and for fever.  Keep yourself hydrated.  Follow-up with your doctor.  Return to the ED with exertional chest pain, pain associate with shortness of breath, nausea, vomiting, sweating or other concerns.

## 2023-03-02 NOTE — ED Notes (Signed)
Date and time results received: 03/02/23 0900  Test: lactic   Critical Value: 3.6  Name of Provider Notified: TEE  Orders Received? Or Actions Taken?: Noted

## 2023-03-02 NOTE — ED Provider Notes (Signed)
[place] [facility] Provider Note   CSN: [id number] Arrival date & time: 03/02/23  0244     History  Chief Complaint  Patient presents with   Shortness of Breath    Devon Burch is a 48 y.o. male.  Patient with history of anxiety, GERD, previous pneumonia "that I almost died from in 03/13/2006" presents with shortness of breath, cough with increased work of breathing tonight.  States he has felt short of breath over the past 1 day and had a dry cough and some congestion.  Has been around family members who have been sick with pneumonia and coughs.  He woke up tonight with a more short of breath struggling to breathe.  He tried using 1 puff of his inhaler without success.  Denies history of asthma but has an albuterol inhaler to use as needed which she does not normally use.  No history of COPD.  Does not smoke.  No known cardiac history.  No history of CHF, CAD, PE or DVT. Reports he has had constant chest pain for the past day as well that is constant worse with breathing. Denies fever.  Denies abdominal pain, nausea, vomiting, leg pain or leg swelling.  No pain with urination or blood in the urine.  The history is provided by the patient.  Shortness of Breath Associated symptoms: cough   Associated symptoms: no abdominal pain, no chest pain, no fever, no headaches, no rash, no sore throat and no vomiting        Home Medications Prior to Admission medications   Medication Sig Start Date End Date Taking? Authorizing Provider  albuterol (VENTOLIN HFA) 108 (90 Base) MCG/ACT inhaler INHALE 2 PUFFS INTO THE LUNGS EVERY 6 HOURS AS NEEDED FOR WHEEZE 01/05/19   [provider]  ALPRAZolam (XANAX PO) Take by mouth.    [provider]  ALPRAZolam Prudy Feeler) 0.25 MG tablet Take 0.25 mg by mouth at bedtime as needed.    [provider]  amitriptyline (ELAVIL) 10 MG tablet Take 10 mg by mouth at bedtime. Patient not taking: Reported  on 04/01/2022    [provider]  cetirizine (ZYRTEC) 10 MG tablet Take 10 mg by mouth daily. 08/30/21   [provider]  pantoprazole (PROTONIX) 40 MG tablet Take 1 tablet (40 mg total) by mouth daily. Take 30-60 min before first meal of the day 05/25/12   Nyoka Cowden, MD  SUMAtriptan (IMITREX) 50 MG tablet Take 1 tablet (50 mg total) by mouth every 2 (two) hours as needed for migraine. May repeat in 2 hours if headache persists or recurs. 05/08/21   Windell Norfolk, MD      Allergies    Sulfa antibiotics and Penicillins    Review of Systems   Review of Systems  Constitutional:  Negative for activity change, appetite change, fatigue and fever.  HENT:  Positive for congestion. Negative for rhinorrhea and sore throat.   Respiratory:  Positive for cough, chest tightness and shortness of breath.   Cardiovascular:  Negative for chest pain.  Gastrointestinal:  Negative for abdominal pain, nausea and vomiting.  Genitourinary:  Negative for dysuria and hematuria.  Musculoskeletal:  Negative for arthralgias and myalgias.  Skin:  Negative for rash.  Neurological:  Negative for dizziness, weakness and headaches.   all other systems are negative except as noted in the HPI and PMH.    Physical Exam Updated Vital Signs BP (!) 149/100   Pulse (!) 104  Temp 99.5 F (37.5 C)   Resp (!) 21   Wt 73.9 kg   SpO2 100%   BMI 26.31 kg/m  Physical Exam Vitals and nursing note reviewed.  Constitutional:      General: He is in acute distress.     Appearance: He is well-developed.     Comments: Uncomfortable, tachypneic, speaking in short phrases Anxious appearing  HENT:     Head: Normocephalic and atraumatic.     Mouth/Throat:     Pharynx: No oropharyngeal exudate.  Eyes:     Conjunctiva/sclera: Conjunctivae normal.     Pupils: Pupils are equal, round, and reactive to light.  Neck:     Comments: No meningismus. Cardiovascular:     Rate and Rhythm: Regular rhythm.  Tachycardia present.     Heart sounds: Normal heart sounds. No murmur heard. Pulmonary:     Effort: Respiratory distress present.     Breath sounds: No wheezing.     Comments: Increased work of breathing with tachypnea but clear lungs without wheezing Abdominal:     Palpations: Abdomen is soft.     Tenderness: There is no abdominal tenderness. There is no guarding or rebound.  Musculoskeletal:        General: No tenderness. Normal range of motion.     Cervical back: Normal range of motion and neck supple.     Right lower leg: No edema.     Left lower leg: No edema.  Skin:    General: Skin is warm.  Neurological:     Mental Status: He is alert and oriented to person, place, and time.     Cranial Nerves: No cranial nerve deficit.     Motor: No abnormal muscle tone.     Coordination: Coordination normal.     Comments:  5/5 strength throughout. CN 2-12 intact.Equal grip strength.   Psychiatric:        Behavior: Behavior normal.     ED Results / Procedures / Treatments   Labs (all labs ordered are listed, but only abnormal results are displayed) Labs Reviewed  RESP PANEL BY RT-PCR (RSV, FLU A&B, COVID)  RVPGX2 - Abnormal; Notable for the following components:      Result Value   Influenza A by PCR POSITIVE (*)    All other components within normal limits  CBC WITH DIFFERENTIAL/PLATELET - Abnormal; Notable for the following components:   WBC 11.5 (*)    Neutro Abs 8.9 (*)    Monocytes Absolute 1.3 (*)    Abs Immature Granulocytes 0.11 (*)    All other components within normal limits  COMPREHENSIVE METABOLIC PANEL - Abnormal; Notable for the following components:   Potassium 3.3 (*)    CO2 19 (*)    Glucose, Bld 128 (*)    All other components within normal limits  LACTIC ACID, PLASMA - Abnormal; Notable for the following components:   Lactic Acid, Venous 4.6 (*)    All other components within normal limits  CBG MONITORING, ED - Abnormal; Notable for the following  components:   Glucose-Capillary 122 (*)    All other components within normal limits  CBG MONITORING, ED - Abnormal; Notable for the following components:   Glucose-Capillary 100 (*)    All other components within normal limits  I-STAT VENOUS BLOOD GAS, ED - Abnormal; Notable for the following components:   pH, Ven 7.506 (*)    pCO2, Ven 24.8 (*)    pO2, Ven 31 (*)    Bicarbonate 19.5 (*)  TCO2 20 (*)    Calcium, Ion 1.01 (*)    All other components within normal limits  CULTURE, BLOOD (ROUTINE X 2)  CULTURE, BLOOD (ROUTINE X 2)  BRAIN NATRIURETIC PEPTIDE  D-DIMER, QUANTITATIVE  LACTIC ACID, PLASMA  TROPONIN I (HIGH SENSITIVITY)  TROPONIN I (HIGH SENSITIVITY)    EKG EKG Interpretation Date/Time:  Monday March 02 2023 02:51:53 EST Ventricular Rate:  117 PR Interval:  144 QRS Duration:  112 QT Interval:  327 QTC Calculation: 457 R Axis:   54  Text Interpretation: Sinus tachycardia Incomplete right bundle branch block Rate faster Artifact Confirmed by Glynn Octave [phone]) on 03/02/2023 2:54:10 AM  Radiology CT Angio Chest PE W and/or Wo Contrast Result Date: 03/02/2023 CLINICAL DATA:  Shortness of breath and dry cough. Clinical concern for pulmonary embolus. EXAM: CT ANGIOGRAPHY CHEST WITH CONTRAST TECHNIQUE: Multidetector CT imaging of the chest was performed using the standard protocol during bolus administration of intravenous contrast. Multiplanar CT image reconstructions and MIPs were obtained to evaluate the vascular anatomy. RADIATION DOSE REDUCTION: This exam was performed according to the departmental dose-optimization program which includes automated exposure control, adjustment of the mA and/or kV according to patient size and/or use of iterative reconstruction technique. CONTRAST:  75mL OMNIPAQUE IOHEXOL 350 MG/ML SOLN COMPARISON:  High-resolution chest CT 03/22/2022 FINDINGS: Cardiovascular: The heart size is normal. No substantial pericardial effusion. No  thoracic aortic aneurysm. No substantial atherosclerosis of the thoracic aorta. There is no filling defect within the opacified pulmonary arteries to suggest the presence of an acute pulmonary embolus. Mediastinum/Nodes: No mediastinal lymphadenopathy. There is no hilar lymphadenopathy. The esophagus has normal imaging features. There is no axillary lymphadenopathy. Lungs/Pleura: 5 mm right middle lobe pulmonary nodule evident on 79/303. 3 mm left upper lobe nodule identified on 30/3 103. No focal airspace consolidation. No pulmonary edema or pleural effusion. Lower lung predominant circumferential bronchial wall thickening evident. Upper Abdomen: Visualized portion of the upper abdomen shows no acute findings. Musculoskeletal: No worrisome lytic or sclerotic osseous abnormality. Review of the MIP images confirms the above findings. IMPRESSION: 1. No CT evidence for acute pulmonary embolus. 2. Lower lung predominant circumferential bronchial wall thickening, likely infectious/inflammatory. 3. 5 mm right middle lobe pulmonary nodule and 3 mm left upper lobe pulmonary nodule. No follow-up needed if patient is low-risk (and has no known or suspected primary neoplasm). Non-contrast chest CT can be considered in 12 months if patient is high-risk. This recommendation follows the consensus statement: Guidelines for Management of Incidental Pulmonary Nodules Detected on CT Images: From the Fleischner Society 2017; Radiology 2017; 284:228-243. Electronically Signed   By: Kennith Center M.D.   On: 03/02/2023 05:58   DG Chest Portable 1 View Result Date: 03/02/2023 CLINICAL DATA:  Shortness of breath and chest pain EXAM: PORTABLE CHEST 1 VIEW COMPARISON:  07/20/2014 FINDINGS: The heart size and mediastinal contours are within normal limits. Both lungs are clear. The visualized skeletal structures are unremarkable. IMPRESSION: No active disease. Electronically Signed   By: Minerva Fester M.D.   On: 03/02/2023 03:25     Procedures .Critical Care  Performed by: Glynn Octave, MD Authorized by: Glynn Octave, MD   Critical care provider statement:    Critical care time (minutes):  60   Critical care time was exclusive of:  Separately billable procedures and treating other patients   Critical care was necessary to treat or prevent imminent or life-threatening deterioration of the following conditions:  Respiratory failure   Critical care was time spent  personally by me on the following activities:  Development of treatment plan with patient or surrogate, discussions with consultants, evaluation of patient's response to treatment, examination of patient, ordering and review of laboratory studies, ordering and review of radiographic studies, ordering and performing treatments and interventions, pulse oximetry, re-evaluation of patient's condition, review of old charts, blood draw for specimens and obtaining history from patient or surrogate   I assumed direction of critical care for this patient from another provider in my specialty: no     Care discussed with: admitting provider   .Cardioversion  Date/Time: 03/02/2023 7:26 AM  Performed by: Glynn Octave, MD Authorized by: Glynn Octave, MD   Consent:    Consent obtained:  Verbal   Consent given by:  Patient   Risks discussed:  Induced arrhythmia, pain, death and cutaneous burn   Alternatives discussed:  Rate-control medication Pre-procedure details:    Cardioversion basis:  Emergent   Rhythm:  Supraventricular tachycardia Patient sedated: No Post-procedure details:    Patient status:  Awake   Patient tolerance of procedure:  Tolerated well, no immediate complications Comments:     Cardioversion for SVT successfully with adenosine.  Failed multiple doses of metoprolol.      Medications Ordered in ED Medications  albuterol (PROVENTIL) (2.5 MG/3ML) 0.083% nebulizer solution 5 mg (has no administration in time range)    ED Course/  Medical Decision Making/ A&P                                 Medical Decision Making Amount and/or Complexity of Data Reviewed Labs: ordered. Decision-making details documented in ED Course. Radiology: ordered and independent interpretation performed. Decision-making details documented in ED Course. ECG/medicine tests: ordered and independent interpretation performed. Decision-making details documented in ED Course.  Risk Prescription drug management.   Shortness of breath x 2 days significantly worse tonight with increased work of breathing, tachycardia, tachypnea pleuritic chest pain and difficulty breathing.  Clear lungs and not hypoxic.  EKG without acute ischemia. May have anxiety component to the breathing.  Consider CHF, metabolic process causing tachypnea such as DKA, PE, pneumonia.  Chest x-ray negative for infiltrate.  No pneumonia seen.  Results reviewed interpreted by me. Influenza swab is positive. D-dimer is negative with low suspicion for PE.  Patient remains very uncomfortable, tachypneic, tachycardic complaining of difficulty breathing though is not wheezing and moving good air.  Ativan trial to assess for anxiety component contributing to difficulty breathing.  He began to feel poorly after Ativan and became diaphoretic and hypotensive and nauseous.  IV fluids and Zofran were given.  VBG consistent with respiratory alkalosis and hyperventilation.  Troponin flat x 2.  No evidence of ACS.  His work of breathing, tachycardia and tachypnea have improved.  ABG not consistent with CO2 retention.  Lactic acid elevated 4.6 likely secondary to his hyperventilation.  He appears improved and is able to ambulate without desaturation.  Will give aggressive IV hydration to improve his lactate.  CT scan obtained to evaluate for occult pneumonia and possibly pulmonary embolism.  This is negative for PE and negative for pneumonia but does show bronchial thickening.  Lung nodules may  follow-up in 1 year.  After coughing fit patient developed sudden tachycardia in the 160s to 180s.  This was regular narrow complex.  He became anxious and tachypneic again.  Metoprolol was attempted with no improvement in heart rate.  Presentation appeared consistent with SVT  given adenosine cardioversion was performed successfully to sinus tachycardia.  Patient tolerated well but remains anxious.  IV Ativan to be given.  Awaiting improvement in his lactic acidosis.  If he feels improved and his lactate has improved he may be stable for discharge pending improvement in his heart rate and respiratory rate.  Patient feels improved after cardioversion.  Heart rate improved to the 100s.  Awaiting improvement of lactic acidosis.  He was able to ambulate without desaturation.  Will refer to cardiology.  Care transferred at shift change to Dr. Rhae Hammock.         Final Clinical Impression(s) / ED Diagnoses Final diagnoses:  Influenza  SVT (supraventricular tachycardia) (HCC)    Rx / DC Orders ED Discharge Orders     None         Kristien Salatino, Jeannett Senior, MD 03/02/23 [phone]

## 2023-03-05 ENCOUNTER — Other Ambulatory Visit: Payer: Self-pay | Admitting: Primary Care

## 2023-03-05 DIAGNOSIS — R911 Solitary pulmonary nodule: Secondary | ICD-10-CM

## 2023-03-07 LAB — CULTURE, BLOOD (ROUTINE X 2)
Culture: NO GROWTH
Culture: NO GROWTH

## 2023-03-09 ENCOUNTER — Other Ambulatory Visit: Payer: Self-pay | Admitting: *Deleted

## 2023-03-09 DIAGNOSIS — J4520 Mild intermittent asthma, uncomplicated: Secondary | ICD-10-CM

## 2023-03-10 ENCOUNTER — Ambulatory Visit (INDEPENDENT_AMBULATORY_CARE_PROVIDER_SITE_OTHER): Payer: Commercial Managed Care - PPO | Admitting: Primary Care

## 2023-03-10 ENCOUNTER — Encounter: Payer: Self-pay | Admitting: Primary Care

## 2023-03-10 VITALS — BP 125/85 | HR 70 | Temp 98.1°F | Ht 66.0 in | Wt 173.6 lb

## 2023-03-10 DIAGNOSIS — R0683 Snoring: Secondary | ICD-10-CM | POA: Diagnosis not present

## 2023-03-10 DIAGNOSIS — R911 Solitary pulmonary nodule: Secondary | ICD-10-CM | POA: Diagnosis not present

## 2023-03-10 MED ORDER — ALPRAZOLAM 0.25 MG PO TABS: 0.2500 mg | ORAL_TABLET | Freq: Every evening | ORAL | 0 refills | Status: DC | PRN

## 2023-03-10 NOTE — Progress Notes (Signed)
@Patient  ID: Devon Burch, male    DOB: July 30, 1975, 48 y.o.   MRN: [id number]  Chief Complaint  Patient presents with   Follow-up    Referring provider: Loyola Mast, NP   Synopsis: Referred in September 2023 for recurrent pneumonia.  Devon Burch had childhood asthma before joining the [employer] where Devon Burch was sent to [place].  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  Devon Burch has noted increased bronchitis symptoms when welding frequently.  Previous [facility] pulmonary encounter: 04/01/22 Devon Burch says that his symptoms are about the same since the last visit.  Devon Burch had COVID a couple weeks ago and Devon Burch still had some left upper anterior chest pain from time to time.  Some shortness of breath from time to time.  No cough, no mucus production.  Devon Burch is here to follow-up the results from the CT scan of his chest.  Devon Burch notes some significant esophageal pain recently with heartburn.  Pulmonary nodule - Plan: CT Chest Wo Contrast   Mild intermittent asthma without complication   Discussion: Devon Burch returns to clinic today for evaluation of chest pain and intermittent shortness of breath.  In general the dyspnea has improved but the chest pain persist.  Devon Burch wonders if it is related to his esophagus.  There is nothing on the CT scan of his chest which would explain the chest pain.  Because of his exposure history I think it is reasonable to follow the pulmonary nodules to completion, 2 years from the original discovery (August 2023).   At this time however, I see no evidence of an underlying lung problem other than perhaps mild intermittent asthma.   Plan: Pulmonary nodule: We will repeat a CT scan of the chest in August 2025   Gastroesophageal reflux disease: Continue antiacid Follow-up with GI   Mild intermittent asthma: Albuterol as needed  for chest tightness wheezing or shortness of breath   Will call you with the results of the CT scan in 2025, if you have any respiratory problems please do not hesitate to call us back    03/10/2023- Interim hx  Discussed the use of AI scribe software for clinical note transcription with the patient, who gave verbal consent to proceed.  History of Present Illness   The patient presents for follow-up of pulmonary nodules. Devon Burch was previously under the care of Dr. Kendrick Fries whom recommended monitoring his lung nodules for 2 years due to occupational exposure. Devon Burch is a never smoker. Devon Burch was just recently seen in ED for influenza. CT scan during that hospital visit for influenza noted an relatively small increase in the right middle lobe nodule from 3 mm to 5 mm, while the left upper lobe nodule is 3 mm. 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  Devon Burch was hospitalized last week for influenza and experienced supraventricular tachycardia, requiring cardioversion. Devon Burch describe the event as 'one of the scariest things that ever happened' and suspects overuse of albuterol may have contributed to his elevated heart rate. Since discharge, Devon Burch has been experiencing some mild residual shortness of breath and pleuritic chest pain, which Devon Burch attributes to recovery from the flu.  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 Devon Burch has a rescue inhaler but is hesitant to use it due to recent events. Devon Burch completed a course of Tamiflu.  Devon Burch also reports having a sleep study through the [place] a couple of years ago and told Devon Burch had mild sleep apnea but did not require CPAP therapy. Devon Burch reports snoring and waking up gasping for air. Devon Burch has been losing weight and hope this will improve his symptoms. Devon Burch also mention experiencing daytime fatigue and falling asleep easily  after work.  Devon Burch has been prescribed amitriptyline and previously had a prescription for Xanax, which Devon Burch has not refilled in three years. Devon Burch experience anxiety related to their recent hospitalization and trouble sleeping at times.      Allergies  Allergen Reactions   Sulfa Antibiotics Shortness Of Breath   Penicillins Other (See Comments)    Unknown reaction as a child    Immunization History  Administered Date(s) Administered   Influenza Split 12/12/2011    Past Medical History:  Diagnosis Date   Anxiety    GERD (gastroesophageal reflux disease)     Tobacco History: Social History   Tobacco Use  Smoking Status Never   Passive exposure: Never  Smokeless Tobacco Not on file   Counseling given: Not Answered   Outpatient Medications Prior to Visit  Medication Sig Dispense Refill   albuterol (VENTOLIN HFA) 108 (90 Base) MCG/ACT inhaler INHALE 2 PUFFS INTO THE LUNGS EVERY 6 HOURS AS NEEDED FOR WHEEZE     ALPRAZolam (XANAX PO) Take by mouth.     ALPRAZolam (XANAX) 0.25 MG tablet Take 0.25 mg by mouth at bedtime as needed.     amitriptyline (ELAVIL) 10 MG tablet Take 10 mg by mouth at bedtime.     cetirizine (ZYRTEC) 10 MG tablet Take 10 mg by mouth daily.     oseltamivir (TAMIFLU) 75 MG capsule Take 1 capsule (75 mg total) by mouth every 12 (twelve) hours. 10 capsule 0   pantoprazole (PROTONIX) 40 MG tablet Take 1 tablet (40 mg total) by mouth daily. Take 30-60 min before first meal of the day 30 tablet 2   SUMAtriptan (IMITREX) 50 MG tablet Take 1 tablet (50 mg total) by mouth every 2 (two) hours as needed for migraine. May repeat in 2 hours if headache persists or recurs. 10 tablet 3   No facility-administered medications prior to visit.   Review of Systems  Review of Systems  Constitutional: Negative.   HENT: Negative.    Respiratory:  Positive for shortness of breath.   Cardiovascular:  Positive for chest pain.   Physical Exam  BP 125/85 (BP Location: Left Arm,  Patient Position: Sitting, Cuff Size: Normal)   Pulse 70   Temp 98.1 F (36.7 C) (Oral)   Ht 5\' 6"  (1.676 m)   Wt 173 lb 9.6 oz (78.7 kg)   SpO2 99%   BMI 28.02 kg/m  Physical Exam Constitutional:      Appearance: Normal appearance.  HENT:     Head: Normocephalic and atraumatic.     Mouth/Throat:     Mouth: Mucous membranes are moist.     Pharynx: Oropharynx is clear.  Cardiovascular:     Rate and Rhythm: Normal rate and regular rhythm.  Pulmonary:     Effort: Pulmonary effort is normal.     Breath sounds: Normal breath sounds. No wheezing or rales.  Skin:    General: Skin is warm and dry.  Neurological:  General: No focal deficit present.     Mental Status: Devon Burch is alert and oriented to person, place, and time. Mental status is at baseline.  Psychiatric:        Mood and Affect: Mood normal.        Behavior: Behavior normal.        Thought Content: Thought content normal.        Judgment: Judgment normal.      Lab Results:  CBC    Component Value Date/Time   WBC 11.5 (H) 03/02/2023 0256   RBC 5.27 03/02/2023 0256   HGB 14.6 03/02/2023 0418   HGB 15.0 01/22/2021 1554   HCT 43.0 03/02/2023 0418   HCT 41.8 01/22/2021 1554   PLT 352 03/02/2023 0256   PLT 289 12015/03/01 0540   MCV 83.7 03/02/2023 0256   MCV 86 01/22/2021 1554   MCV 90 12015/03/01 0540   MCH 29.8 03/02/2023 0256   MCHC 35.6 03/02/2023 0256   RDW 13.0 03/02/2023 0256   RDW 12.4 01/22/2021 1554   RDW 13.0 12015/03/01 0540   LYMPHSABS 1.1 03/02/2023 0256   LYMPHSABS 1.8 01/22/2021 1554   MONOABS 1.3 (H) 03/02/2023 0256   EOSABS 0.2 03/02/2023 0256   EOSABS 0.2 01/22/2021 1554   BASOSABS 0.0 03/02/2023 0256   BASOSABS 0.1 01/22/2021 1554    BMET    Component Value Date/Time   NA 139 03/02/2023 0418   NA 143 01/22/2021 1554   NA 139 12015/03/01 0540   K 3.5 03/02/2023 0418   K 3.8 12015/03/01 0540   CL 104 03/02/2023 0256   CL 107 12015/03/01 0540   CO2 19 (L) 03/02/2023 0256   CO2 24  12015/03/01 0540   GLUCOSE 128 (H) 03/02/2023 0256   GLUCOSE 88 12015/03/01 0540   BUN 14 03/02/2023 0256   BUN 11 01/22/2021 1554   BUN 10 12015/03/01 0540   CREATININE 1.00 03/02/2023 0256   CREATININE 1.01 12015/03/01 0540   CALCIUM 9.8 03/02/2023 0256   CALCIUM 8.8 12015/03/01 0540   GFRNONAA >60 03/02/2023 0256   GFRNONAA >60 12015/03/01 0540   GFRAA >60 10/08/2015 1640   GFRAA >60 12015/03/01 0540    BNP    Component Value Date/Time   BNP 13.9 03/02/2023 0256    ProBNP No results found for: "PROBNP"  Imaging: CT Angio Chest PE W and/or Wo Contrast Result Date: 03/02/2023 CLINICAL DATA:  Shortness of breath and dry cough. Clinical concern for pulmonary embolus. EXAM: CT ANGIOGRAPHY CHEST WITH CONTRAST TECHNIQUE: Multidetector CT imaging of the chest was performed using the standard protocol during bolus administration of intravenous contrast. Multiplanar CT image reconstructions and MIPs were obtained to evaluate the vascular anatomy. RADIATION DOSE REDUCTION: This exam was performed according to the departmental dose-optimization program which includes automated exposure control, adjustment of the mA and/or kV according to patient size and/or use of iterative reconstruction technique. CONTRAST:  75mL OMNIPAQUE IOHEXOL 350 MG/ML SOLN COMPARISON:  High-resolution chest CT 03/22/2022 FINDINGS: Cardiovascular: The heart size is normal. No substantial pericardial effusion. No thoracic aortic aneurysm. No substantial atherosclerosis of the thoracic aorta. There is no filling defect within the opacified pulmonary arteries to suggest the presence of an acute pulmonary embolus. Mediastinum/Nodes: No mediastinal lymphadenopathy. There is no hilar lymphadenopathy. The esophagus has normal imaging features. There is no axillary lymphadenopathy. Lungs/Pleura: 5 mm right middle lobe pulmonary nodule evident on 79/303. 3 mm left upper lobe nodule identified on 30/3 103. No focal airspace consolidation. No  pulmonary edema  or pleural effusion. Lower lung predominant circumferential bronchial wall thickening evident. Upper Abdomen: Visualized portion of the upper abdomen shows no acute findings. Musculoskeletal: No worrisome lytic or sclerotic osseous abnormality. Review of the MIP images confirms the above findings. IMPRESSION: 1. No CT evidence for acute pulmonary embolus. 2. Lower lung predominant circumferential bronchial wall thickening, likely infectious/inflammatory. 3. 5 mm right middle lobe pulmonary nodule and 3 mm left upper lobe pulmonary nodule. No follow-up needed if patient is low-risk (and has no known or suspected primary neoplasm). Non-contrast chest CT can be considered in 12 months if patient is high-risk. This recommendation follows the consensus statement: Guidelines for Management of Incidental Pulmonary Nodules Detected on CT Images: From the Fleischner Society 2017; Radiology 2017; 284:228-243. Electronically Signed   By: Kennith Center M.D.   On: 03/02/2023 05:58   DG Chest Portable 1 View Result Date: 03/02/2023 CLINICAL DATA:  Shortness of breath and chest pain EXAM: PORTABLE CHEST 1 VIEW COMPARISON:  07/20/2014 FINDINGS: The heart size and mediastinal contours are within normal limits. Both lungs are clear. The visualized skeletal structures are unremarkable. IMPRESSION: No active disease. Electronically Signed   By: Minerva Fester M.D.   On: 03/02/2023 03:25     Assessment & Plan:   1. Loud snoring (Primary) - Home sleep test; Future  2. Pulmonary nodule - CT Chest Wo Contrast; Future     Pulmonary Nodules Small nodules in right middle lobe (5mm) and left upper lobe (3mm) with slight growth over the past year. Patient has never smoked but occupational exposure to environmental hazards. -Reschedule CT scan for January 2026 to monitor nodules (recommended monitoring lung nodules for a total 2 years to assess stability)   Influenza -Recent hospitalization with influenza,  complicated by supraventricular tachycardia requiring cardioversion. Currently recovering with mild residual breathlessness and pleuritic chest pain. Lungs were clear on exam and VSS. Completed Tamiflu.   Mild intermittent asthma - Hx childhood asthma with recurrent bronchitis. Patient wanted to postpone PFTs due to recent influenza. Asthma symptoms well until recent viral illness  - Perform FeNO test to assess for airway inflammation - Continue Albuterol hfa 2 puffs every 4-6 hours for breakthrough shortness of breath/wheezing   Anxiety Recent traumatic hospital experience leading to increased anxiety. Patient has a previous prescription for Xanax from the [place], but has not been refilled in several years. -Provide temporary prescription for Xanax (0.25mg , 10 tablets) for anxiety and sleep.  Sleep Apnea Patient reports mild sleep apnea diagnosed by home sleep study a few years ago through the [place]. Patient reports snoring and waking up gasping for air. No current treatment. -Order home sleep study to reassess sleep apnea severity. Follow up in 2-3 weeks after study completion.     Glenford Bayley, NP 03/10/2023

## 2023-03-10 NOTE — Patient Instructions (Addendum)
-  PULMONARY NODULES: Pulmonary nodules are small growths in the lungs. Your recent CT scan showed a slight increase in the size of the nodule in your right middle lobe from 3 mm to 5 mm, while the left upper lobe nodule remains small at 3 mm. We will reschedule a CT scan for January 2026 to monitor these nodules.  -INFLUENZA: Influenza is a viral infection that affects your respiratory system. You were recently hospitalized for influenza, which was complicated by an episode of supraventricular tachycardia. You are currently recovering but still experiencing some breathlessness and chest pain. We will perform a FeNO test to check for airway inflammation. Continue Albuterol   -ANXIETY: Anxiety is a feeling of worry or fear that can be intense. Your recent hospital experience has increased your anxiety levels. We will provide a temporary prescription for Xanax (0.25mg , 10 tablets) to help manage your anxiety and improve your sleep. Follow up with PCP.  -SLEEP APNEA: Sleep apnea is a condition where your breathing repeatedly stops and starts during sleep. You have a history of mild sleep apnea and are experiencing symptoms like snoring and waking up gasping for air. We will order a home sleep study to reassess the severity of your sleep apnea and follow up in 2-3 weeks after the study is completed.  INSTRUCTIONS: Please schedule a follow-up CT scan for January 2026 to monitor your pulmonary nodules. Additionally, we will perform a FeNO test to assess for airway inflammation. You will receive a temporary prescription for Xanax to help manage your anxiety. Lastly, we will arrange a home sleep study to reassess your sleep apnea, and you should follow up in 2-3 weeks after completing the study. Reschedule pulmonary function testing.   Follow-up 6 months with PFTs / needs to establish with Dr. Francine Graven (new-patient)

## 2023-03-11 ENCOUNTER — Ambulatory Visit: Payer: Commercial Managed Care - PPO | Attending: Cardiology | Admitting: Cardiology

## 2023-03-11 ENCOUNTER — Ambulatory Visit: Payer: Commercial Managed Care - PPO | Attending: Cardiology

## 2023-03-11 ENCOUNTER — Encounter: Payer: Self-pay | Admitting: Cardiology

## 2023-03-11 ENCOUNTER — Telehealth: Payer: Self-pay

## 2023-03-11 VITALS — BP 130/74 | HR 83 | Ht 66.0 in | Wt 173.6 lb

## 2023-03-11 DIAGNOSIS — I471 Supraventricular tachycardia, unspecified: Secondary | ICD-10-CM

## 2023-03-11 DIAGNOSIS — D126 Benign neoplasm of colon, unspecified: Secondary | ICD-10-CM | POA: Insufficient documentation

## 2023-03-11 DIAGNOSIS — E7849 Other hyperlipidemia: Secondary | ICD-10-CM | POA: Diagnosis not present

## 2023-03-11 DIAGNOSIS — M1990 Unspecified osteoarthritis, unspecified site: Secondary | ICD-10-CM | POA: Insufficient documentation

## 2023-03-11 DIAGNOSIS — R002 Palpitations: Secondary | ICD-10-CM | POA: Diagnosis not present

## 2023-03-11 DIAGNOSIS — R040 Epistaxis: Secondary | ICD-10-CM | POA: Insufficient documentation

## 2023-03-11 DIAGNOSIS — M25569 Pain in unspecified knee: Secondary | ICD-10-CM | POA: Insufficient documentation

## 2023-03-11 DIAGNOSIS — K589 Irritable bowel syndrome without diarrhea: Secondary | ICD-10-CM | POA: Insufficient documentation

## 2023-03-11 DIAGNOSIS — S335XXA Sprain of ligaments of lumbar spine, initial encounter: Secondary | ICD-10-CM | POA: Insufficient documentation

## 2023-03-11 DIAGNOSIS — Z0289 Encounter for other administrative examinations: Secondary | ICD-10-CM | POA: Insufficient documentation

## 2023-03-11 DIAGNOSIS — Z789 Other specified health status: Secondary | ICD-10-CM | POA: Insufficient documentation

## 2023-03-11 NOTE — Progress Notes (Signed)
Cardiology Consultation:    Date:  03/11/2023   ID:  Devon Burch, DOB 09-03-1975, MRN [id number]  PCP:  Loyola Mast, NP  Cardiologist:  Gypsy Balsam, MD   Referring MD: Glynn Octave, MD   Chief Complaint  Patient presents with   svt   Hospitalization Follow-up    History of Present Illness:    Devon Burch is a 47 y.o. male who is being seen today for the evaluation of supraventricular tachycardia at the request of Rancour, Jeannett Senior, MD. past medical history significant for heart murmur, he did have coronary CT angio done in July 23, 2020 which show CAD RADS score 0, he also had echocardiogram done in May 2023 which showed normal function of the heart without significant pathology, recently he end up coming to the hospital with cold-like symptoms, he was found to have a flu.  While he was there he was given also bronchodilators and developed narrow complex tachycardia.  Look at the rhythm strips and those rhythm strips look like sinus tachycardia, he was given metoprolol without effect and eventually adenosine has been used to convert to sinus rhythm and apparently did converted to sinus rhythm.  Since that time he is doing fine he also got history of anxiety as well as family hyperlipidemia his cholesterol very of high he does have prescription for Repatha in the matter-of-fact he does have 3 Repatha as an inspiration but he is afraid to use it.  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  Does have family history of hyperlipidemia some family members already on Repatha does have family history of premature coronary disease as well.  Have 2 children and actually 1 child does have elevated cholesterol already.  He is in the office with his wife and she participated in decision making  Past Medical History:  Diagnosis Date   Anxiety    GERD (gastroesophageal reflux disease)     Past Surgical History:  Procedure  Laterality Date   HEMORRHOID SURGERY     KNEE SURGERY      Current Medications: Current Meds  Medication Sig   albuterol (VENTOLIN HFA) 108 (90 Base) MCG/ACT inhaler Inhale 2 puffs into the lungs every 6 (six) hours as needed for wheezing or shortness of breath.   ALPRAZolam (XANAX) 0.25 MG tablet Take 1 tablet (0.25 mg total) by mouth at bedtime as needed for anxiety or sleep.   amitriptyline (ELAVIL) 10 MG tablet Take 10 mg by mouth at bedtime.   cetirizine (ZYRTEC) 10 MG tablet Take 10 mg by mouth daily.   oseltamivir (TAMIFLU) 75 MG capsule Take 1 capsule (75 mg total) by mouth every 12 (twelve) hours.   pantoprazole (PROTONIX) 40 MG tablet Take 1 tablet (40 mg total) by mouth daily. Take 30-60 min before first meal of the day   SUMAtriptan (IMITREX) 50 MG tablet Take 1 tablet (50 mg total) by mouth every 2 (two) hours as needed for migraine. May repeat in 2 hours if headache persists or recurs.     Allergies:   Sulfa antibiotics, Doxycycline, Hyoscyamine, Lovastatin, Penicillins, and Esomeprazole   Social History   Socioeconomic History   Marital status: Married    Spouse name: Not on file   Number of children: Not on file   Years of education: Not on file   Highest education level: Not on file  Occupational History   Not on file  Tobacco Use   Smoking  status: Never    Passive exposure: Never   Smokeless tobacco: Not on file  Vaping Use   Vaping status: Never Used  Substance and Sexual Activity   Alcohol use: Yes   Drug use: No   Sexual activity: Not Currently  Other Topics Concern   Not on file  Social History Narrative   Not on file   Social Drivers of Health   Financial Resource Strain: Low Risk  (10/15/2022)   Received [facility]   Overall Financial Resource Strain (CARDIA)    Difficulty of Paying Living Expenses: Not hard at all  Food Insecurity: No Food Insecurity (10/15/2022)   Received from [facility]   Hunger Vital Sign    Worried About  Running Out of Food in the Last Year: Never true    Ran Out of Food in the Last Year: Never true  Transportation Needs: No Transportation Needs (10/15/2022)   Received from [facility] - Transportation    Lack of Transportation (Medical): No    Lack of Transportation (Non-Medical): No  Physical Activity: Not on file  Stress: No Stress Concern Present (02/08/2020)   Received from [facility], [facility] - Occupational Stress Questionnaire    Feeling of Stress : Only a little  Social Connections: Unknown (06/10/2021)   Received from [facility], [facility]   Social Network    Social Network: Not on file     Family History: The patient's family history includes Emphysema in his paternal grandmother; Heart disease in his maternal grandfather and paternal grandfather. ROS:   Please see the history of present illness.    All 14 point review of systems negative except as described per history of present illness.  EKGs/Labs/Other Studies Reviewed:    The following studies were reviewed today:   EKG:  EKG Interpretation Date/Time:  Wednesday March 11 2023 15:50:46 EST Ventricular Rate:  83 PR Interval:  148 QRS Duration:  96 QT Interval:  384 QTC Calculation: 451 R Axis:   -3  Text Interpretation: Normal sinus rhythm Normal ECG When compared with ECG of 02-Mar-2023 07:21, PREVIOUS ECG IS PRESENT Confirmed by Gypsy Balsam [phone]) on 03/11/2023 3:57:53 PM    Recent Labs: 03/02/2023: ALT 39; B Natriuretic Peptide 13.9; BUN 14; Creatinine, Ser 1.00; Hemoglobin 14.6; Platelets 352; Potassium 3.5; Sodium 139  Recent Lipid Panel No results found for: "CHOL", "TRIG", "HDL", "CHOLHDL", "VLDL", "LDLCALC", "LDLDIRECT"  Physical Exam:    VS:  BP 130/74 (BP Location: Right Arm, Patient Position: Sitting)   Pulse 83   Ht 5\' 6"  (1.676 m)   Wt 173 lb 9.6 oz (78.7 kg)   SpO2 98%   BMI 28.02 kg/m     Wt Readings from Last 3  Encounters:  03/11/23 173 lb 9.6 oz (78.7 kg)  03/10/23 173 lb 9.6 oz (78.7 kg)  03/02/23 163 lb (73.9 kg)     GEN:  Well nourished, well developed in no acute distress HEENT: Normal NECK: No JVD; No carotid bruits LYMPHATICS: No lymphadenopathy CARDIAC: RRR, no murmurs, no rubs, no gallops RESPIRATORY:  Clear to auscultation without rales, wheezing or rhonchi  ABDOMEN: Soft, non-tender, non-distended MUSCULOSKELETAL:  No edema; No deformity  SKIN: Warm and dry NEUROLOGIC:  Alert and oriented x 3 PSYCHIATRIC:  Normal affect   ASSESSMENT:    1. SVT (supraventricular tachycardia) (HCC)   2. Supraventricular tachycardia (HCC)   3. Familial hyperlipidemia   4. Palpitations  PLAN:    In order of problems listed above:  Supraventricular tachycardia.  I did review rhythm strips to really truly look like sinus tachycardia to me.  However I will ask him to wear Zio patch for 2 weeks to see if he get any episodes of supraventricular tachycardia. He tells me about leaky valve on the physical exam I do not hear any murmur I will get echocardiogram to assess left ventricle ejection fraction. Familial hyperlipidemia that make me worried the most he does have 3 Repatha's at home and he is afraid to use it we had a long discussion about this I strongly encouraged him to do it I also told him if he wanted [place] to give him medication while he is in our office will be more glad to do that. Palpitations denies having any now.   Medication Adjustments/Labs and Tests Ordered: Current medicines are reviewed at length with the patient today.  Concerns regarding medicines are outlined above.  Orders Placed This Encounter  Procedures   LONG TERM MONITOR (3-14 DAYS)   EKG 12-Lead   ECHOCARDIOGRAM COMPLETE   No orders of the defined types were placed in this encounter.   Signed, Georgeanna Lea, MD, [facility]. 03/11/2023 4:59 PM    [facility]

## 2023-03-11 NOTE — Patient Instructions (Signed)
Medication Instructions:  Your physician recommends that you continue on your current medications as directed. Please refer to the Current Medication list given to you today.  *If you need a refill on your cardiac medications before your next appointment, please call your pharmacy*   Lab Work: None Ordered If you have labs (blood work) drawn today and your tests are completely normal, you will receive your results only by: MyChart Message (if you have MyChart) OR A paper copy in the mail If you have any lab test that is abnormal or we need to change your treatment, we will call you to review the results.   Testing/Procedures: Your physician has requested that you have an echocardiogram. Echocardiography is a painless test that uses sound waves to create images of your heart. It provides your doctor with information about the size and shape of your heart and how well your heart's chambers and valves are working. This procedure takes approximately one hour. There are no restrictions for this procedure. Please do NOT wear cologne, perfume, aftershave, or lotions (deodorant is allowed). Please arrive 15 minutes prior to your appointment time.  Please note: We ask at that you not bring children with you during ultrasound (echo/ vascular) testing. Due to room size and safety concerns, children are not allowed in the ultrasound rooms during exams. Our front office staff cannot provide observation of children in our lobby area while testing is being conducted. An adult accompanying a patient to their appointment will only be allowed in the ultrasound room at the discretion of the ultrasound technician under special circumstances. We apologize for any inconvenience.    WHY IS MY DOCTOR PRESCRIBING ZIO? The Zio system is proven and trusted by physicians to detect and diagnose irregular heart rhythms -- and has been prescribed to hundreds of thousands of patients.  The FDA has cleared the Zio system to  monitor for many different kinds of irregular heart rhythms. In a study, physicians were able to reach a diagnosis 90% of the time with the Zio system1.  You can wear the Zio monitor -- a small, discreet, comfortable patch -- during your normal day-to-day activity, including while you sleep, shower, and exercise, while it records every single heartbeat for analysis.  1Barrett, P., et al. Comparison of 24 Hour Holter Monitoring Versus 14 Day Novel Adhesive Patch Electrocardiographic Monitoring. American Journal of Medicine, 2014.  ZIO VS. HOLTER MONITORING The Zio monitor can be comfortably worn for up to 14 days. Holter monitors can be worn for 24 to 48 hours, limiting the time to record any irregular heart rhythms you may have. Zio is able to capture data for the 51% of patients who have their first symptom-triggered arrhythmia after 48 hours.1  LIVE WITHOUT RESTRICTIONS The Zio ambulatory cardiac monitor is a small, unobtrusive, and water-resistant patch--you might even forget you're wearing it. The Zio monitor records and stores every beat of your heart, whether you're sleeping, working out, or showering.     Follow-Up: At [facility], you and your health needs are our priority.  As part of our continuing mission to provide you with exceptional heart care, we have created designated Provider Care Teams.  These Care Teams include your primary Cardiologist (physician) and Advanced Practice Providers (APPs -  Physician Assistants and Nurse Practitioners) who all work together to provide you with the care you need, when you need it.  We recommend signing up for the patient portal called "MyChart".  Sign up information is provided on this  After Visit Summary.  MyChart is used to connect with patients for Virtual Visits (Telemedicine).  Patients are able to view lab/test results, encounter notes, upcoming appointments, etc.  Non-urgent messages can be sent to your provider as well.   To learn more  about what you can do with MyChart, go to [email or web address].    Your next appointment:   2 month(s)  The format for your next appointment:   In Person  Provider:   Gypsy Balsam, MD    Other Instructions NA

## 2023-03-11 NOTE — Telephone Encounter (Signed)
Open in error

## 2023-04-01 ENCOUNTER — Ambulatory Visit: Payer: Commercial Managed Care - PPO

## 2023-04-02 ENCOUNTER — Other Ambulatory Visit: Payer: Commercial Managed Care - PPO

## 2023-04-02 DIAGNOSIS — I471 Supraventricular tachycardia, unspecified: Secondary | ICD-10-CM

## 2023-04-03 ENCOUNTER — Telehealth: Payer: Self-pay

## 2023-04-03 NOTE — Telephone Encounter (Signed)
-----   Message from Gypsy Balsam sent at 04/03/2023  9:15 AM EST ----- Monitor did not show any significant arrhythmia, continue monitoring

## 2023-04-03 NOTE — Telephone Encounter (Signed)
 Patient notified through my chart and LM informing the patient.

## 2023-04-15 ENCOUNTER — Ambulatory Visit: Payer: Commercial Managed Care - PPO | Attending: Cardiology

## 2023-04-15 ENCOUNTER — Other Ambulatory Visit: Payer: Self-pay

## 2023-04-15 DIAGNOSIS — I471 Supraventricular tachycardia, unspecified: Secondary | ICD-10-CM | POA: Diagnosis not present

## 2023-04-15 LAB — ECHOCARDIOGRAM COMPLETE
Area-P 1/2: 3.66 cm2
S' Lateral: 3.2 cm

## 2023-04-16 ENCOUNTER — Telehealth: Payer: Self-pay

## 2023-04-16 NOTE — Telephone Encounter (Signed)
 Patient notified through my chart and LM

## 2023-04-16 NOTE — Telephone Encounter (Signed)
-----   Message from [place] sent at 04/16/2023 12:41 PM EST ----- Normal or stable result  Good result this is a normal echocardiogram.

## 2023-05-11 ENCOUNTER — Ambulatory Visit: Payer: Commercial Managed Care - PPO | Admitting: Cardiology

## 2023-05-31 NOTE — Progress Notes (Deleted)
  Cardiology Office Note:  .   Date:  05/31/2023  ID:  Devon Burch, DOB 03/19/1975, MRN [id number] PCP: Cory Dingwall, NP  [facility] Providers Cardiologist:  None { Click to update primary MD,subspecialty MD or APP then REFRESH:1}   History of Present Illness: Devon Burch   Devon Burch is a 48 y.o. male with a past medical history of SVT, migraines, OSA, Barrett's esophagus, IBS, NASH, gouty arthritis, history of tobacco use.  04/15/2023 echo EF 60 to 65%, GLS is abnormal, no valvular abnormalities 03/11/2023 monitor average heart rate 75 bpm, predominant rhythm was sinus, 2 episodes of SVT occurred lasting for 5 beats, triggered events were associated with sinus tachycardia 01/17/2022 echo EF 55 to 60%, mild diastolic dysfunction, trace MR, trace TR 07/23/2020 coronary CTA calcium score of 0  He established care with Dr. Gordan Latina on 03/11/2023 for evaluation of SVT for which he had been hospitalized for.  It appeared to be sinus tachycardia, was given metoprolol  without change, eventually adenosine  was used and this converted him back to sinus rhythm.  A monitor was arranged as well as an echocardiogram and he was advised to follow-up in 2 months. ROS: ***  Studies Reviewed: .        *** Risk Assessment/Calculations:   {Does this patient have ATRIAL FIBRILLATION?:[id number]} No BP recorded.  {Refresh Note OR Click here to enter BP  :1}***       Physical Exam:   VS:  There were no vitals taken for this visit.   Wt Readings from Last 3 Encounters:  03/11/23 173 lb 9.6 oz (78.7 kg)  03/10/23 173 lb 9.6 oz (78.7 kg)  03/02/23 163 lb (73.9 kg)    GEN: Well nourished, well developed in no acute distress NECK: No JVD; No carotid bruits CARDIAC: ***RRR, no murmurs, rubs, gallops RESPIRATORY:  Clear to auscultation without rales, wheezing or rhonchi  ABDOMEN: Soft, non-tender, non-distended EXTREMITIES:  No edema; No deformity   ASSESSMENT AND PLAN: .   ***    {Are you ordering  a CV Procedure (e.g. stress test, cath, DCCV, TEE, etc)?   Press F2        :[id number]}  Dispo: ***  Signed, Terrance Ferretti, NP

## 2023-06-01 ENCOUNTER — Encounter: Payer: Self-pay | Admitting: *Deleted

## 2023-06-01 DIAGNOSIS — S39012A Strain of muscle, fascia and tendon of lower back, initial encounter: Secondary | ICD-10-CM | POA: Insufficient documentation

## 2023-06-02 ENCOUNTER — Ambulatory Visit: Admitting: Cardiology

## 2023-06-02 DIAGNOSIS — R002 Palpitations: Secondary | ICD-10-CM

## 2023-06-02 DIAGNOSIS — E7849 Other hyperlipidemia: Secondary | ICD-10-CM

## 2023-06-02 DIAGNOSIS — I471 Supraventricular tachycardia, unspecified: Secondary | ICD-10-CM

## 2023-06-02 DIAGNOSIS — E782 Mixed hyperlipidemia: Secondary | ICD-10-CM

## 2023-06-03 NOTE — Progress Notes (Unsigned)
 Cardiology Office Note:  .   Date:  06/04/2023  ID:  [name], DOB [birth year], MRN [id number] PCP: Cory Dingwall, NP  [facility] Providers Cardiologist:  Ralene Burger, MD    History of Present Illness: Devon Burch   Devon Burch is a 48 y.o. male with a past medical history of SVT, migraines, OSA, Barrett's esophagus, IBS, NASH, gouty arthritis, history of tobacco use.  04/15/2023 echo EF 60 to 65%, GLS is abnormal, no valvular abnormalities 03/11/2023 monitor average heart rate 75 bpm, predominant rhythm was sinus, 2 episodes of SVT occurred lasting for 5 beats, triggered events were associated with sinus tachycardia 01/17/2022 echo EF 55 to 60%, mild diastolic dysfunction, trace MR, trace TR 07/23/2020 coronary CTA calcium score of 0  He established care with Dr. Gordan Latina on 03/11/2023 for evaluation of SVT for which he had been hospitalized for, SVT was not treated successfully with medications.  It appeared to be sinus tachycardia, was given metoprolol  without change, eventually adenosine  was used and this converted him back to sinus rhythm.  A monitor was arranged as well as an echocardiogram and he was advised to follow-up in 2 months.  He presents today for follow-up after recent testing as outlined above.  He has not had any further episodes of SVT.  He has made significant lifestyle changes as well.  He began taking Repatha and is tolerated this well, repeat fasting lipid panel by his PCP revealed LDL much better controlled at 99-he has only been on the Repatha about a month and a half and I anticipate it will come down more.  We discussed his episodes of SVT, does not necessarily need medication as the episodes are very infrequent and he is very relieved as he does not want to take any medications if not absolutely necessary.  He does workout approximately 3 times a week mostly with cardio.  He is a former Cytogeneticist. He denies chest pain, palpitations, dyspnea, pnd, orthopnea, n,  v, dizziness, syncope, edema, weight gain, or early satiety.   ROS: Review of Systems  All other systems reviewed and are negative.    Studies Reviewed: .        Cardiac Studies & Procedures   ______________________________________________________________________________________________     ECHOCARDIOGRAM  ECHOCARDIOGRAM COMPLETE 04/15/2023  Narrative ECHOCARDIOGRAM REPORT    Patient Name:   Devon Burch Date of Exam: 04/15/2023 Medical Rec #:  [id number]         Height:       66.0 in Accession #:    [id number]        Weight:       173.6 lb Date of Birth:  1975/06/26         BSA:          1.883 m Patient Age:    47 years          BP:           130/74 mmHg Patient Gender: M                 HR:           68 bpm. Exam Location:  [facility]  Procedure: 2D Echo, Cardiac Doppler, Color Doppler and Strain Analysis (Both Spectral and Color Flow Doppler were utilized during procedure).  Indications:    SVT (supraventricular tachycardia) (HCC) [I47.10 (ICD-10-CM)]  History:        Patient has no prior history of Echocardiogram examinations. Familial hyperlipidemia.  Sonographer:  Erminia Hazel RDCS Referring Phys: [id number] ROBERT J KRASOWSKI  IMPRESSIONS   1. Left ventricular ejection fraction, by estimation, is 60 to 65%. The left ventricle has normal function. The left ventricle has no regional wall motion abnormalities. Left ventricular diastolic parameters were normal. The average left ventricular global longitudinal strain is -17.4 %. The global longitudinal strain is abnormal. 2. Right ventricular systolic function is normal. The right ventricular size is normal. There is normal pulmonary artery systolic pressure. 3. The mitral valve is normal in structure. No evidence of mitral valve regurgitation. No evidence of mitral stenosis. 4. The aortic valve is normal in structure. Aortic valve regurgitation is not visualized. No aortic stenosis is present. 5. The inferior vena  cava is normal in size with greater than 50% respiratory variability, suggesting right atrial pressure of 3 mmHg.  FINDINGS Left Ventricle: Left ventricular ejection fraction, by estimation, is 60 to 65%. The left ventricle has normal function. The left ventricle has no regional wall motion abnormalities. The average left ventricular global longitudinal strain is -17.4 %. Strain was performed and the global longitudinal strain is abnormal. The left ventricular internal cavity size was normal in size. There is no left ventricular hypertrophy. Left ventricular diastolic parameters were normal.  Right Ventricle: The right ventricular size is normal. No increase in right ventricular wall thickness. Right ventricular systolic function is normal. There is normal pulmonary artery systolic pressure. The tricuspid regurgitant velocity is 2.48 m/s, and with an assumed right atrial pressure of 3 mmHg, the estimated right ventricular systolic pressure is 27.6 mmHg.  Left Atrium: Left atrial size was normal in size.  Right Atrium: Right atrial size was normal in size.  Pericardium: There is no evidence of pericardial effusion.  Mitral Valve: The mitral valve is normal in structure. No evidence of mitral valve regurgitation. No evidence of mitral valve stenosis.  Tricuspid Valve: The tricuspid valve is normal in structure. Tricuspid valve regurgitation is not demonstrated. No evidence of tricuspid stenosis.  Aortic Valve: The aortic valve is normal in structure. Aortic valve regurgitation is not visualized. No aortic stenosis is present.  Pulmonic Valve: The pulmonic valve was normal in structure. Pulmonic valve regurgitation is not visualized. No evidence of pulmonic stenosis.  Aorta: The aortic root is normal in size and structure.  Venous: The inferior vena cava is normal in size with greater than 50% respiratory variability, suggesting right atrial pressure of 3 mmHg.  IAS/Shunts: No atrial level  shunt detected by color flow Doppler.  Additional Comments: 3D was performed not requiring image post processing on an independent workstation and was indeterminate.   LEFT VENTRICLE PLAX 2D LVIDd:         4.60 cm   Diastology LVIDs:         3.20 cm   LV e' medial:    10.70 cm/s LV PW:         0.80 cm   LV E/e' medial:  6.9 LV IVS:        0.80 cm   LV e' lateral:   12.90 cm/s LVOT diam:     2.00 cm   LV E/e' lateral: 5.7 LV SV:         67 LV SV Index:   36        2D Longitudinal Strain LVOT Area:     3.14 cm  2D Strain GLS Avg:     -17.4 %   RIGHT VENTRICLE  IVC RV Basal diam:  3.70 cm     IVC diam: 1.90 cm RV Mid diam:    3.10 cm RV S prime:     12.10 cm/s TAPSE (M-mode): 2.2 cm  LEFT ATRIUM             Index        RIGHT ATRIUM           Index LA diam:        3.40 cm 1.81 cm/m   RA Area:     16.80 cm LA Vol (A2C):   31.9 ml 16.94 ml/m  RA Volume:   45.80 ml  24.32 ml/m LA Vol (A4C):   32.4 ml 17.21 ml/m LA Biplane Vol: 34.9 ml 18.53 ml/m AORTIC VALVE LVOT Vmax:   101.00 cm/s LVOT Vmean:  65.750 cm/s LVOT VTI:    0.214 m  AORTA Ao Root diam: 3.00 cm Ao Asc diam:  3.10 cm Ao Desc diam: 1.90 cm  MITRAL VALVE               TRICUSPID VALVE MV Area (PHT): 3.66 cm    TR Peak grad:   24.6 mmHg MV Decel Time: 208 msec    TR Vmax:        248.00 cm/s MV E velocity: 73.35 cm/s MV A velocity: 62.40 cm/s  SHUNTS MV E/A ratio:  1.18        Systemic VTI:  0.21 m Systemic Diam: 2.00 cm  Hillis Lu MD Electronically signed by Hillis Lu MD Signature Date/Time: 04/15/2023/3:50:01 PM    Final    MONITORS  LONG TERM MONITOR (3-14 DAYS) 04/02/2023  Narrative Patch Wear Time:  12 days and 15 hours (2025-01-29T16:40:25-0500 to 2025-02-11T08:05:06-498)  Patient had a min HR of 47 bpm, max HR of 171 bpm, and avg HR of 75 bpm. Predominant underlying rhythm was Sinus Rhythm. 2 Supraventricular Tachycardia runs occurred, the run with the fastest interval  lasting 5 beats with a max rate of 171 bpm, the longest lasting 4 beats with an avg rate of 116 bpm. Isolated SVEs were rare (<1.0%), SVE Couplets were rare (<1.0%), and no SVE Triplets were present. No Isolated VEs, VE Couplets, or VE Triplets were present.  Summary and conclusions: 2 episode of supraventricular tachycardia longest episode 5 beats Multiple triggered events for sinus tachycardia       ______________________________________________________________________________________________      Risk Assessment/Calculations:            Physical Exam:   VS:  BP (!) 124/90   Pulse 70   Ht 5\' 6"  (1.676 m)   Wt 165 lb (74.8 kg)   SpO2 98%   BMI 26.63 kg/m    Wt Readings from Last 3 Encounters:  06/04/23 165 lb (74.8 kg)  03/11/23 173 lb 9.6 oz (78.7 kg)  03/10/23 173 lb 9.6 oz (78.7 kg)    GEN: Fit, Well nourished, well developed in no acute distress NECK: No JVD; No carotid bruits CARDIAC: RRR, no murmurs, rubs, gallops RESPIRATORY:  Clear to auscultation without rales, wheezing or rhonchi  ABDOMEN: Soft, non-tender, non-distended EXTREMITIES:  No edema; No deformity   ASSESSMENT AND PLAN: .   SVT -currently quiescent.  We discussed starting low-dose beta-blocker or short acting calcium channel blocker however he is adamant he does not want to take any medications unless absolutely necessary and it does not appear it is necessary at this time.  His episode of SVT was preceded by a severe case  of the flu, he had been given many medications including steroids, he has not had any recurrent episodes of SVT.  Dyslipidemia-currently on Repatha.   Anxiety/PTSD-currently on Xanax  and Elavil, managed by his PCP.        Dispo: Follow-up in 1 year.  Signed, Terrance Ferretti, NP

## 2023-06-04 ENCOUNTER — Encounter: Payer: Self-pay | Admitting: Cardiology

## 2023-06-04 ENCOUNTER — Ambulatory Visit: Attending: Cardiology | Admitting: Cardiology

## 2023-06-04 VITALS — BP 124/90 | HR 70 | Ht 66.0 in | Wt 165.0 lb

## 2023-06-04 DIAGNOSIS — R002 Palpitations: Secondary | ICD-10-CM | POA: Diagnosis not present

## 2023-06-04 DIAGNOSIS — I471 Supraventricular tachycardia, unspecified: Secondary | ICD-10-CM

## 2023-06-04 DIAGNOSIS — E7849 Other hyperlipidemia: Secondary | ICD-10-CM

## 2023-06-04 NOTE — Patient Instructions (Signed)
 Medication Instructions:  Your physician recommends that you continue on your current medications as directed. Please refer to the Current Medication list given to you today.  *If you need a refill on your cardiac medications before your next appointment, please call your pharmacy*  Lab Work: None If you have labs (blood work) drawn today and your tests are completely normal, you will receive your results only by: MyChart Message (if you have MyChart) OR A paper copy in the mail If you have any lab test that is abnormal or we need to change your treatment, we will call you to review the results.  Testing/Procedures: None  Follow-Up: At [facility], you and your health needs are our priority.  As part of our continuing mission to provide you with exceptional heart care, our providers are all part of one team.  This team includes your primary Cardiologist (physician) and Advanced Practice Providers or APPs (Physician Assistants and Nurse Practitioners) who all work together to provide you with the care you need, when you need it.  Your next appointment:   1 year(s)  Provider:   Ralene Burger, MD    We recommend signing up for the patient portal called "MyChart".  Sign up information is provided on this After Visit Summary.  MyChart is used to connect with patients for Virtual Visits (Telemedicine).  Patients are able to view lab/test results, encounter notes, upcoming appointments, etc.  Non-urgent messages can be sent to your provider as well.   To learn more about what you can do with MyChart, go to [email or web address].   Other Instructions None

## 2023-08-04 ENCOUNTER — Telehealth: Payer: Self-pay | Admitting: Cardiology

## 2023-08-04 NOTE — Telephone Encounter (Signed)
 Patient c/o Palpitations:  STAT if patient reporting lightheadedness, shortness of breath, or chest pain  How long have you had palpitations/irregular HR/ Afib? Are you having the symptoms now? Palps x  1 Month, they come and go   Are you currently experiencing lightheadedness, SOB or CP? Gets a little out of breath here and there  Do you have a history of afib (atrial fibrillation) or irregular heart rhythm? no  Have you checked your BP or HR? (document readings if available): yes it has been normal  Are you experiencing any other symptoms? Heart fluttering

## 2023-08-05 ENCOUNTER — Encounter: Payer: Commercial Managed Care - PPO | Admitting: Internal Medicine

## 2023-08-05 NOTE — Progress Notes (Deleted)
 Office Visit Note  Patient: Devon Burch             Date of Birth: 1975/05/22           MRN: [id number]             PCP: Rosalva Doffing, NP Referring: [place], Jesse J, PA-C Visit Date: 08/05/2023 Occupation: @GUAROCC @  Subjective:  No chief complaint on file.   History of Present Illness: Devon Burch is a 48 y.o. male ***     Activities of Daily Living:  Patient reports morning stiffness for *** {minute/hour:19697}.   Patient {ACTIONS;DENIES/REPORTS:21021675::Denies} nocturnal pain.  Difficulty dressing/grooming: {ACTIONS;DENIES/REPORTS:21021675::Denies} Difficulty climbing stairs: {ACTIONS;DENIES/REPORTS:21021675::Denies} Difficulty getting out of chair: {ACTIONS;DENIES/REPORTS:21021675::Denies} Difficulty using hands for taps, buttons, cutlery, and/or writing: {ACTIONS;DENIES/REPORTS:21021675::Denies}  No Rheumatology ROS completed.   PMFS History:  Patient Active Problem List   Diagnosis Date Noted   Strain of lumbar region 06/01/2023   Irritable bowel syndrome 03/11/2023   Benign neoplasm of colon 03/11/2023   Condition influencing health status 03/11/2023   Lumbar sprain 03/11/2023   Degenerative arthritis 03/11/2023   Health examination of defined subpopulation 03/11/2023   Pain in joint, lower leg 03/11/2023   Epistaxis 03/11/2023   Supraventricular tachycardia (HCC) 03/11/2023   Familial hypercholesterolemia 11/28/2022   Dyspepsia 04/08/2022   NASH (nonalcoholic steatohepatitis) 01/17/2022   Chronic idiopathic gout involving toe of right foot without tophus 01/22/2021   Patellar tendinitis of left knee 01/16/2021   Right upper lobe pulmonary nodule 09/14/2020   Chronic headache disorder 09/13/2020   Abnormal liver function tests 09/13/2020   Gouty arthritis 09/13/2020   Acute bilateral low back pain without sciatica 09/10/2020   Acne vulgaris 08/30/2020   Abdominal pain 08/30/2020   Arthralgia of knee 08/30/2020   Adjustment disorder  with anxiety 08/30/2020   Continuous nicotine dependence 08/30/2020   Dysphagia 08/30/2020   Difficulty breathing 08/30/2020   Derangement of posterior horn of medial meniscus 08/30/2020   Folliculitis 08/30/2020   Gout 08/30/2020   Migraine, unspecified, not intractable, without status migrainosus 08/30/2020   Familial hyperlipidemia 08/30/2020   Hx of colonic polyps 08/30/2020   Hematuria 08/30/2020   Palpitations 08/30/2020   Pain, abdominal, epigastric 08/30/2020   Pain with urination 08/30/2020   Pain of anterior chest wall with respiration 08/30/2020   Sprain, ribs 08/30/2020   Sleep apnea, unspecified 08/30/2020   Skin rash 08/30/2020   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   Tonsillitis 08/30/2020   Tobacco use 08/30/2020   Internal derangement of right knee 08/30/2020   Vitamin D deficiency 08/30/2020   Peripheral tear of medial meniscus of right knee as current injury 06/21/2019   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   Weakness of left hand 05/09/2019   Crushing injury of left forearm 04/15/2019   Wrist pain 04/01/2019   Hand injury 03/18/2019   Unilateral primary osteoarthritis, right knee 09/14/2018   Left ankle pain 03/17/2018   Right foot pain 03/17/2018   Esophageal reflux 05/26/2016   Depression, unspecified 03/18/2016   Anxiety disorder, unspecified 03/18/2016   Acute ethmoidal sinusitis, unspecified 01/02/2016   Acute bronchitis 07/20/2014   Neck pain 05/25/2014   Primary thunderclap headache 05/25/2014   Perennial allergic rhinitis with seasonal variation 05/09/2014   Unspecified intracranial injury with loss of consciousness status unknown, initial encounter (HCC) 05/09/2014   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   Barrett esophagus 08/02/2013   Adjustment disorder with mixed anxiety and depressed mood 08/02/2013   Terminal ileitis without complication (HCC) 08/02/2013   Cough 05/28/2012  Hemoptysis 05/28/2012   Migraine without aura and without status migrainosus,  not intractable 08/16/2010   Mild intermittent asthma without complication 08/16/2010   Reflux esophagitis 08/16/2010    Past Medical History:  Diagnosis Date   Anxiety    GERD (gastroesophageal reflux disease)     Family History  Problem Relation Age of Onset   Emphysema Paternal Grandmother    Heart disease Paternal Grandfather    Heart disease Maternal Grandfather    Past Surgical History:  Procedure Laterality Date   HEMORRHOID SURGERY     KNEE SURGERY     Social History   Social History Narrative   Not on file   Immunization History  Administered Date(s) Administered   Anthrax 05/26/2007, 07/13/2007, 08/10/2007, 02/11/2008, 06/26/2009   Fluzone Influenza virus vaccine,trivalent (IIV3), split virus 11/11/2015, 11/10/2016   Hep A / Hep B 10/20/2003, 12/20/2003   Hepatitis A, Adult 06/04/2004   Hepatitis B, ADULT 06/04/2004   IPV 10/20/2003   Influenza Nasal 12/20/2003, 01/10/2005, 12/08/2005, 10/23/2006, 12/04/2008   Influenza Split 11/20/2007, 12/12/2011   Influenza, Seasonal, Injecte, Preservative Fre 11/24/2017   Influenza,inj,Quad PF,6+ Mos 12/02/2016, 12/18/2017, 11/29/2018   Influenza,inj,quad, With Preservative 12/28/2013   Influenza-Unspecified 11/20/2007, 02/10/2010, 04/02/2010, 12/02/2010, 12/03/2011, 12/12/2011, 11/19/2012, 12/28/2013, 11/26/2014, 11/24/2017   MMR 10/20/2003   Meningococcal polysaccharide vaccine (MPSV4) 10/20/2003   Novel Infuenza-h1n1-09 01/05/2008   PPD Test 10/20/2003   Pneumococcal Polysaccharide-23 11/19/2012, 12/15/2012   Pneumococcal-Unspecified 12/15/2012   Smallpox 04/16/2004   Td 10/20/2003   Tdap 12/15/2013   Typhoid Inactivated 04/16/2004, 05/26/2007, 06/26/2009     Objective: Vital Signs: There were no vitals taken for this visit.   Physical Exam   Musculoskeletal Exam: ***  CDAI Exam: CDAI Score: -- Patient Global: --; Provider Global: -- Swollen: --; Tender: -- Joint Exam 08/05/2023   No joint exam has been  documented for this visit   There is currently no information documented on the homunculus. Go to the Rheumatology activity and complete the homunculus joint exam.  Investigation: No additional findings.  Imaging: No results found.  Recent Labs: Lab Results  Component Value Date   WBC 11.5 (H) 03/02/2023   HGB 14.6 03/02/2023   PLT 352 03/02/2023   NA 139 03/02/2023   K 3.5 03/02/2023   CL 104 03/02/2023   CO2 19 (L) 03/02/2023   GLUCOSE 128 (H) 03/02/2023   BUN 14 03/02/2023   CREATININE 1.00 03/02/2023   BILITOT 0.7 03/02/2023   ALKPHOS 86 03/02/2023   AST 25 03/02/2023   ALT 39 03/02/2023   PROT 7.3 03/02/2023   ALBUMIN 4.2 03/02/2023   CALCIUM 9.8 03/02/2023   GFRAA >60 10/08/2015    Speciality Comments: No specialty comments available.  Procedures:  No procedures performed Allergies: Sulfa antibiotics, Doxycycline , Hyoscyamine, Lovastatin, Penicillins, and Esomeprazole    Assessment / Plan:     Visit Diagnoses: No diagnosis found.  Orders: No orders of the defined types were placed in this encounter.  No orders of the defined types were placed in this encounter.   Face-to-face time spent with patient was *** minutes. Greater than 50% of time was spent in counseling and coordination of care.  Follow-Up Instructions: No follow-ups on file.   Lonni LELON Ester, MD  Note - This record has been created using [employer].  Chart creation errors have been sought, but may not always  have been located. Such creation errors do not reflect on  the standard of medical care.

## 2023-08-07 NOTE — Telephone Encounter (Signed)
Patient is following up due to not hearing back from anyone. Please advise.

## 2023-08-07 NOTE — Telephone Encounter (Signed)
 Pt states that these sx are intermittent and feels more like a flutter and it causes him to cough. Pt did not want a monitor and was unsure of medication. Advised to keep a log of sx, what he is doing at the time and how long they last. Pt verbalized understanding and had no additional questions.

## 2023-08-11 ENCOUNTER — Ambulatory Visit (INDEPENDENT_AMBULATORY_CARE_PROVIDER_SITE_OTHER)

## 2023-08-11 ENCOUNTER — Encounter: Payer: Self-pay | Admitting: Cardiology

## 2023-08-11 ENCOUNTER — Ambulatory Visit: Attending: Cardiology | Admitting: Cardiology

## 2023-08-11 VITALS — BP 110/80 | HR 75 | Ht 66.0 in | Wt 169.0 lb

## 2023-08-11 DIAGNOSIS — E782 Mixed hyperlipidemia: Secondary | ICD-10-CM | POA: Diagnosis not present

## 2023-08-11 DIAGNOSIS — R002 Palpitations: Secondary | ICD-10-CM | POA: Diagnosis not present

## 2023-08-11 DIAGNOSIS — I471 Supraventricular tachycardia, unspecified: Secondary | ICD-10-CM

## 2023-08-11 DIAGNOSIS — M791 Myalgia, unspecified site: Secondary | ICD-10-CM | POA: Insufficient documentation

## 2023-08-11 NOTE — Patient Instructions (Addendum)
 Medication Instructions:  Your physician recommends that you continue on your current medications as directed. Please refer to the Current Medication list given to you today.  *If you need a refill on your cardiac medications before your next appointment, please call your pharmacy*  Lab Work: Your physician recommends that you return for lab work in:   Labs today: CMP, Magnesium , CBC, TSH, Lipids  If you have labs (blood work) drawn today and your tests are completely normal, you will receive your results only by: MyChart Message (if you have MyChart) OR A paper copy in the mail If you have any lab test that is abnormal or we need to change your treatment, we will call you to review the results.  Testing/Procedures: A zio monitor was ordered today. It will remain on for 7 days. You will then return monitor and event diary in provided box. It takes 1-2 weeks for report to be downloaded and returned to us . We will call you with the results. If monitor falls off or has orange flashing light, please call Zio for further instructions.    Follow-Up: At [facility], you and your health needs are our priority.  As part of our continuing mission to provide you with exceptional heart care, our providers are all part of one team.  This team includes your primary Cardiologist (physician) and Advanced Practice Providers or APPs (Physician Assistants and Nurse Practitioners) who all work together to provide you with the care you need, when you need it.  Your next appointment:   Keep appointment with Dr. Bernie  Provider:   Lamar Bernie, MD    We recommend signing up for the patient portal called MyChart.  Sign up information is provided on this After Visit Summary.  MyChart is used to connect with patients for Virtual Visits (Telemedicine).  Patients are able to view lab/test results, encounter notes, upcoming appointments, etc.  Non-urgent messages can be sent to your provider as  well.   To learn more about what you can do with MyChart, go to [email or web address].   Other Instructions None

## 2023-08-11 NOTE — Progress Notes (Signed)
 Cardiology Office Note:  .   Date:  08/11/2023  ID:  PRESS CASALE, DOB Jan 25, 1976, MRN [id number] PCP: Rosalva Doffing, NP  [facility] Providers Cardiologist:  Lamar Fitch, MD    History of Present Illness: Devon Burch is a 48 y.o. male with a past medical history of SVT, migraines, OSA, Barrett's esophagus, IBS, NASH, gouty arthritis, history of tobacco use.  04/15/2023 echo EF 60 to 65%, GLS is abnormal, no valvular abnormalities 03/11/2023 monitor average heart rate 75 bpm, predominant rhythm was sinus, 2 episodes of SVT occurred lasting for 5 beats, triggered events were associated with sinus tachycardia 01/17/2022 echo EF 55 to 60%, mild diastolic dysfunction, trace MR, trace TR 07/23/2020 coronary CTA calcium score of 0  He established care with Dr. Fitch on 03/11/2023 for evaluation of SVT for which he had been hospitalized for, SVT was not treated successfully with medications.  It appeared to be sinus tachycardia, was given metoprolol  without change, eventually adenosine  was used and this converted him back to sinus rhythm.  A monitor was arranged as well as an echocardiogram and he was advised to follow-up in 2 months.  Most recently evaluated by myself on 06/04/2023, not had any recurrent episodes of SVT and made significant lifestyle changes, tolerating his Repatha well, continue to workout several times a week with plans to follow-up in 1 year.  He presents today for follow-up of palpitations, they have been bothering him with increase in frequency over the last month.  They feel somewhat different than his prior episodes of SVT, when these start he typically has a cough associated with them and this is uncomfortable for him.  He does continue to workout, not bothered by episodes during this time.  He is drinking approximately energy drink each day, discussed that these likely contributing to his symptoms. He denies chest pain, palpitations, dyspnea, pnd, orthopnea,  n, v, dizziness, syncope, edema, weight gain, or early satiety.    ROS: Review of Systems  Cardiovascular:  Positive for palpitations.  Psychiatric/Behavioral:  The patient is nervous/anxious (reports anxiety).   All other systems reviewed and are negative.    Studies Reviewed: Devon   EKG Interpretation Date/Time:  Tuesday August 11 2023 09:51:25 EDT Ventricular Rate:  75 PR Interval:  140 QRS Duration:  96 QT Interval:  394 QTC Calculation: 439 R Axis:   51  Text Interpretation: Normal sinus rhythm Normal ECG When compared with ECG of 11-Mar-2023 15:50, No significant change was found Confirmed by Carlin Nest [phone]) on 08/11/2023 9:54:57 AM    Cardiac Studies & Procedures   ______________________________________________________________________________________________     ECHOCARDIOGRAM  ECHOCARDIOGRAM COMPLETE 04/15/2023  Narrative ECHOCARDIOGRAM REPORT    Patient Name:   Devon Burch Date of Exam: 04/15/2023 Medical Rec #:  [id number]         Height:       66.0 in Accession #:    [id number]        Weight:       173.6 lb Date of Birth:  Jun 28, 1975         BSA:          1.883 m Patient Age:    47 years          BP:           130/74 mmHg Patient Gender: M                 HR:  68 bpm. Exam Location:  [place]  Procedure: 2D Echo, Cardiac Doppler, Color Doppler and Strain Analysis (Both Spectral and Color Flow Doppler were utilized during procedure).  Indications:    SVT (supraventricular tachycardia) (HCC) [I47.10 (ICD-10-CM)]  History:        Patient has no prior history of Echocardiogram examinations. Familial hyperlipidemia.  Sonographer:    Charlie Jointer RDCS Referring Phys: [id number] ROBERT J [facility]  IMPRESSIONS   1. Left ventricular ejection fraction, by estimation, is 60 to 65%. The left ventricle has normal function. The left ventricle has no regional wall motion abnormalities. Left ventricular diastolic parameters were normal. The average left  ventricular global longitudinal strain is -17.4 %. The global longitudinal strain is abnormal. 2. Right ventricular systolic function is normal. The right ventricular size is normal. There is normal pulmonary artery systolic pressure. 3. The mitral valve is normal in structure. No evidence of mitral valve regurgitation. No evidence of mitral stenosis. 4. The aortic valve is normal in structure. Aortic valve regurgitation is not visualized. No aortic stenosis is present. 5. The inferior vena cava is normal in size with greater than 50% respiratory variability, suggesting right atrial pressure of 3 mmHg.  FINDINGS Left Ventricle: Left ventricular ejection fraction, by estimation, is 60 to 65%. The left ventricle has normal function. The left ventricle has no regional wall motion abnormalities. The average left ventricular global longitudinal strain is -17.4 %. Strain was performed and the global longitudinal strain is abnormal. The left ventricular internal cavity size was normal in size. There is no left ventricular hypertrophy. Left ventricular diastolic parameters were normal.  Right Ventricle: The right ventricular size is normal. No increase in right ventricular wall thickness. Right ventricular systolic function is normal. There is normal pulmonary artery systolic pressure. The tricuspid regurgitant velocity is 2.48 m/s, and with an assumed right atrial pressure of 3 mmHg, the estimated right ventricular systolic pressure is 27.6 mmHg.  Left Atrium: Left atrial size was normal in size.  Right Atrium: Right atrial size was normal in size.  Pericardium: There is no evidence of pericardial effusion.  Mitral Valve: The mitral valve is normal in structure. No evidence of mitral valve regurgitation. No evidence of mitral valve stenosis.  Tricuspid Valve: The tricuspid valve is normal in structure. Tricuspid valve regurgitation is not demonstrated. No evidence of tricuspid stenosis.  Aortic  Valve: The aortic valve is normal in structure. Aortic valve regurgitation is not visualized. No aortic stenosis is present.  Pulmonic Valve: The pulmonic valve was normal in structure. Pulmonic valve regurgitation is not visualized. No evidence of pulmonic stenosis.  Aorta: The aortic root is normal in size and structure.  Venous: The inferior vena cava is normal in size with greater than 50% respiratory variability, suggesting right atrial pressure of 3 mmHg.  IAS/Shunts: No atrial level shunt detected by color flow Doppler.  Additional Comments: 3D was performed not requiring image post processing on an independent workstation and was indeterminate.   LEFT VENTRICLE PLAX 2D LVIDd:         4.60 cm   Diastology LVIDs:         3.20 cm   LV e' medial:    10.70 cm/s LV PW:         0.80 cm   LV E/e' medial:  6.9 LV IVS:        0.80 cm   LV e' lateral:   12.90 cm/s LVOT diam:     2.00 cm   LV E/e'  lateral: 5.7 LV SV:         67 LV SV Index:   36        2D Longitudinal Strain LVOT Area:     3.14 cm  2D Strain GLS Avg:     -17.4 %   RIGHT VENTRICLE             IVC RV Basal diam:  3.70 cm     IVC diam: 1.90 cm RV Mid diam:    3.10 cm RV S prime:     12.10 cm/s TAPSE (M-mode): 2.2 cm  LEFT ATRIUM             Index        RIGHT ATRIUM           Index LA diam:        3.40 cm 1.81 cm/m   RA Area:     16.80 cm LA Vol (A2C):   31.9 ml 16.94 ml/m  RA Volume:   45.80 ml  24.32 ml/m LA Vol (A4C):   32.4 ml 17.21 ml/m LA Biplane Vol: 34.9 ml 18.53 ml/m AORTIC VALVE LVOT Vmax:   101.00 cm/s LVOT Vmean:  65.750 cm/s LVOT VTI:    0.214 m  AORTA Ao Root diam: 3.00 cm Ao Asc diam:  3.10 cm Ao Desc diam: 1.90 cm  MITRAL VALVE               TRICUSPID VALVE MV Area (PHT): 3.66 cm    TR Peak grad:   24.6 mmHg MV Decel Time: 208 msec    TR Vmax:        248.00 cm/s MV E velocity: 73.35 cm/s MV A velocity: 62.40 cm/s  SHUNTS MV E/A ratio:  1.18        Systemic VTI:  0.21 m Systemic  Diam: 2.00 cm  Kamylah Manzo Crape MD Electronically signed by Saher Davee Crape MD Signature Date/Time: 04/15/2023/3:50:01 PM    Final    MONITORS  LONG TERM MONITOR (3-14 DAYS) 04/02/2023  Narrative Patch Wear Time:  12 days and 15 hours (2025-01-29T16:40:25-0500 to 2025-02-11T08:05:06-498)  Patient had a min HR of 47 bpm, max HR of 171 bpm, and avg HR of 75 bpm. Predominant underlying rhythm was Sinus Rhythm. 2 Supraventricular Tachycardia runs occurred, the run with the fastest interval lasting 5 beats with a max rate of 171 bpm, the longest lasting 4 beats with an avg rate of 116 bpm. Isolated SVEs were rare (<1.0%), SVE Couplets were rare (<1.0%), and no SVE Triplets were present. No Isolated VEs, VE Couplets, or VE Triplets were present.  Summary and conclusions: 2 episode of supraventricular tachycardia longest episode 5 beats Multiple triggered events for sinus tachycardia       ______________________________________________________________________________________________      Risk Assessment/Calculations:            Physical Exam:   VS:  BP 110/80   Pulse 75   Ht 5' 6 (1.676 m)   Wt 169 lb (76.7 kg)   SpO2 98%   BMI 27.28 kg/m    Wt Readings from Last 3 Encounters:  08/11/23 169 lb (76.7 kg)  06/04/23 165 lb (74.8 kg)  03/11/23 173 lb 9.6 oz (78.7 kg)    GEN: Fit, Well nourished, well developed in no acute distress NECK: No JVD; No carotid bruits CARDIAC: RRR, no murmurs, rubs, gallops RESPIRATORY:  Clear to auscultation without rales, wheezing or rhonchi  ABDOMEN: Soft, non-tender, non-distended EXTREMITIES:  No edema; No  deformity   ASSESSMENT AND PLAN: .   SVT/palpitations -slightly increasing with frequency, they typically last only for a few seconds but are bothersome for him and now accompanied with a cough.  We did discuss starting a low-dose beta-blocker versus repeating a monitor, he would like to avoid any unnecessary medications at all cost if he is  agreeable to repeat a 7-day monitor.  Check CBC, TSH, magnesium  for any contributory causes.  Dyslipidemia-currently on Repatha.  Will repeat CMET and fasting lipid panel.  Anxiety/PTSD-currently on Xanax  and Elavil, managed by his PCP.        Dispo: 7-day monitor, labs for above, keep recall for a year.  Signed, Delon JAYSON Hoover, NP

## 2023-08-12 ENCOUNTER — Ambulatory Visit: Payer: Self-pay | Admitting: Cardiology

## 2023-08-12 ENCOUNTER — Telehealth: Payer: Self-pay | Admitting: Primary Care

## 2023-08-12 LAB — COMPREHENSIVE METABOLIC PANEL WITH GFR
ALT: 52 IU/L — ABNORMAL HIGH (ref 0–44)
AST: 28 IU/L (ref 0–40)
Albumin: 5 g/dL (ref 4.1–5.1)
Alkaline Phosphatase: 114 IU/L (ref 44–121)
BUN/Creatinine Ratio: 12 (ref 9–20)
BUN: 12 mg/dL (ref 6–24)
Bilirubin Total: 0.7 mg/dL (ref 0.0–1.2)
CO2: 21 mmol/L (ref 20–29)
Calcium: 10 mg/dL (ref 8.7–10.2)
Chloride: 102 mmol/L (ref 96–106)
Creatinine, Ser: 1.04 mg/dL (ref 0.76–1.27)
Globulin, Total: 2.4 g/dL (ref 1.5–4.5)
Glucose: 102 mg/dL — ABNORMAL HIGH (ref 70–99)
Potassium: 4.8 mmol/L (ref 3.5–5.2)
Sodium: 139 mmol/L (ref 134–144)
Total Protein: 7.4 g/dL (ref 6.0–8.5)
eGFR: 90 mL/min/1.73

## 2023-08-12 LAB — CBC
Hematocrit: 48.7 % (ref 37.5–51.0)
Hemoglobin: 15.7 g/dL (ref 13.0–17.7)
MCH: 29.1 pg (ref 26.6–33.0)
MCHC: 32.2 g/dL (ref 31.5–35.7)
MCV: 90 fL (ref 79–97)
Platelets: 322 x10E3/uL (ref 150–450)
RBC: 5.4 x10E6/uL (ref 4.14–5.80)
RDW: 14 % (ref 11.6–15.4)
WBC: 6 x10E3/uL (ref 3.4–10.8)

## 2023-08-12 LAB — LIPID PANEL
Chol/HDL Ratio: 4 ratio (ref 0.0–5.0)
Cholesterol, Total: 200 mg/dL — ABNORMAL HIGH (ref 100–199)
HDL: 50 mg/dL
LDL Chol Calc (NIH): 121 mg/dL — ABNORMAL HIGH (ref 0–99)
Triglycerides: 166 mg/dL — ABNORMAL HIGH (ref 0–149)
VLDL Cholesterol Cal: 29 mg/dL (ref 5–40)

## 2023-08-12 LAB — MAGNESIUM: Magnesium: 2.4 mg/dL — ABNORMAL HIGH (ref 1.6–2.3)

## 2023-08-12 LAB — TSH: TSH: 1.29 u[IU]/mL (ref 0.450–4.500)

## 2023-08-12 NOTE — Telephone Encounter (Signed)
 I wrote in the scheduling instructions January 2026, I apologize if they had contacted him to schedule sooner than this. Nothing further needed

## 2023-08-13 ENCOUNTER — Telehealth: Payer: Self-pay

## 2023-08-13 NOTE — Telephone Encounter (Signed)
 Pt viewed lab results on My Chart per Dr. Vanetta Shawl note. Routed to PCP.

## 2023-08-18 ENCOUNTER — Ambulatory Visit: Admitting: Cardiology

## 2023-09-03 ENCOUNTER — Telehealth: Payer: Self-pay | Admitting: *Deleted

## 2023-09-03 NOTE — Telephone Encounter (Signed)
 Spoke with pt about Zio. Only wore for 3 days because it fell off while tubing. Pt stated that Delon Hoover instructed him to wear until it fell off and then send back in. Zio will be imported and sent to [facility] for evaluation

## 2023-09-07 NOTE — Telephone Encounter (Signed)
Patient calling back for update. Please advise  

## 2023-09-14 ENCOUNTER — Telehealth: Payer: Self-pay | Admitting: Primary Care

## 2023-09-14 NOTE — Telephone Encounter (Signed)
 Called patient regarding his recent scheduling through MyChart. No answer and left a VM.   Although the scheduling was correct as far as timeframe, E. Hope, NP, did request for the patient to schedule a new patient f/u to establish with a MD. If patient is to return call, he will need to reschedule with Dr. Kara per [facility].   Patient will need a CT prior to this appt as well.   Nothing further at this time.

## 2023-09-22 DIAGNOSIS — I471 Supraventricular tachycardia, unspecified: Secondary | ICD-10-CM

## 2023-11-05 ENCOUNTER — Ambulatory Visit: Admitting: Primary Care

## 2023-11-11 ENCOUNTER — Ambulatory Visit: Admitting: Pulmonary Disease

## 2023-11-22 ENCOUNTER — Ambulatory Visit (HOSPITAL_BASED_OUTPATIENT_CLINIC_OR_DEPARTMENT_OTHER)
Admission: RE | Admit: 2023-11-22 | Discharge: 2023-11-22 | Disposition: A | Discharge status: Home / Self Care | Source: Ambulatory Visit | Attending: Primary Care | Admitting: Primary Care

## 2023-11-22 DIAGNOSIS — R911 Solitary pulmonary nodule: Secondary | ICD-10-CM | POA: Insufficient documentation

## 2023-11-23 ENCOUNTER — Ambulatory Visit: Payer: Self-pay | Admitting: Primary Care

## 2023-11-23 ENCOUNTER — Ambulatory Visit: Payer: Self-pay

## 2023-11-23 NOTE — Progress Notes (Signed)
 You are seeing patient this week, would you be able to review CT chest during visit

## 2023-11-23 NOTE — Telephone Encounter (Signed)
 Patient called with concerns about his results of his CT of lung. He wants a call back to discuss.   Patient also wants to pass along that he was called because his PFT from staff was cancelled but he still wants to keep his appt for this upcoming Wednesday.     Copied from CRM [id number]. Topic: Clinical - Lab/Test Results >> Nov 23, 2023  8:42 AM Devon Burch wrote: Reason for CRM: Patient is calling very concerned over CT results. Patient requesting to speak to a nurse for peace of mind at this time.

## 2023-11-25 ENCOUNTER — Encounter (HOSPITAL_BASED_OUTPATIENT_CLINIC_OR_DEPARTMENT_OTHER)

## 2023-11-25 ENCOUNTER — Ambulatory Visit

## 2023-11-25 ENCOUNTER — Telehealth: Payer: Self-pay | Admitting: Neurology

## 2023-11-25 VITALS — BP 143/90 | HR 72 | Temp 98.3°F | Ht 66.0 in | Wt 177.2 lb

## 2023-11-25 DIAGNOSIS — Z77128 Contact with and (suspected) exposure to other hazards in the physical environment: Secondary | ICD-10-CM

## 2023-11-25 DIAGNOSIS — Z77018 Contact with and (suspected) exposure to other hazardous metals: Secondary | ICD-10-CM

## 2023-11-25 DIAGNOSIS — R911 Solitary pulmonary nodule: Secondary | ICD-10-CM

## 2023-11-25 DIAGNOSIS — R918 Other nonspecific abnormal finding of lung field: Secondary | ICD-10-CM | POA: Diagnosis not present

## 2023-11-25 DIAGNOSIS — Z572 Occupational exposure to dust: Secondary | ICD-10-CM

## 2023-11-25 DIAGNOSIS — J329 Chronic sinusitis, unspecified: Secondary | ICD-10-CM | POA: Diagnosis not present

## 2023-11-25 DIAGNOSIS — J45909 Unspecified asthma, uncomplicated: Secondary | ICD-10-CM | POA: Diagnosis not present

## 2023-11-25 DIAGNOSIS — Z77012 Contact with and (suspected) exposure to uranium: Secondary | ICD-10-CM

## 2023-11-25 DIAGNOSIS — Z880 Allergy status to penicillin: Secondary | ICD-10-CM

## 2023-11-25 LAB — CBC WITH DIFFERENTIAL/PLATELET
Basophils Absolute: 0.1 K/uL (ref 0.0–0.1)
Basophils Relative: 0.9 % (ref 0.0–3.0)
Eosinophils Absolute: 0.2 K/uL (ref 0.0–0.7)
Eosinophils Relative: 2.4 % (ref 0.0–5.0)
HCT: 42.2 % (ref 39.0–52.0)
Hemoglobin: 14.6 g/dL (ref 13.0–17.0)
Lymphocytes Relative: 15.5 % (ref 12.0–46.0)
Lymphs Abs: 1.2 K/uL (ref 0.7–4.0)
MCHC: 34.5 g/dL (ref 30.0–36.0)
MCV: 87.4 fl (ref 78.0–100.0)
Monocytes Absolute: 0.7 K/uL (ref 0.1–1.0)
Monocytes Relative: 9.5 % (ref 3.0–12.0)
Neutro Abs: 5.6 K/uL (ref 1.4–7.7)
Neutrophils Relative %: 71.7 % (ref 43.0–77.0)
Platelets: 334 K/uL (ref 150.0–400.0)
RBC: 4.83 Mil/uL (ref 4.22–5.81)
RDW: 13.1 % (ref 11.5–15.5)
WBC: 7.8 K/uL (ref 4.0–10.5)

## 2023-11-25 MED ORDER — CEFPODOXIME PROXETIL 200 MG PO TABS: 200.0000 mg | ORAL_TABLET | Freq: Two times a day (BID) | ORAL | 0 refills | Status: DC

## 2023-11-25 NOTE — Progress Notes (Signed)
 Subjective:   PATIENT ID: Devon Burch GENDER: male DOB: 1975/05/07, MRN: [id number]   HPI Discussed the use of AI scribe software for clinical note transcription with the patient, who gave verbal consent to proceed.  History of Present Illness Devon Burch is a 48 year old male with asthma and lung nodules who presents with chest pain and respiratory symptoms following occupational exposure.  He has experienced chest pain for a couple of years, which has recently worsened due to increased anxiety. The pain is sometimes exacerbated by deep breathing. He has previously consulted a cardiologist regarding this issue.  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 Initially, he did not wear a mask, resulting in headaches, sinus pain, and a sore throat. He has since been wearing a particle mask for the past week and a half.  A recent CT scan showed additional tiny nodules and changes in his lymph nodes, as reported to him by his clinician. His old lung nodules have not changed in size. He experiences significant anxiety about these findings.  He reports ongoing symptoms including sinus congestion, headaches, and a feeling of general malaise for the past three weeks. No current sore throat, but he continues to experience sinus issues and headaches almost daily.  His past medical history includes asthma, for which he uses an albuterol  inhaler rarely, approximately once or twice a month, and he avoids using it due to a severe reaction experienced in the past. He has a history of high cholesterol, managed with Repatha.  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     Past Medical  History:  Diagnosis Date   Anxiety    GERD (gastroesophageal reflux disease)      Family History  Problem Relation Age of Onset   Emphysema Paternal Grandmother    Heart disease Paternal Grandfather    Heart disease Maternal Grandfather      Social History   Socioeconomic History   Marital status: Married    Spouse name: Not on file   Number of children: Not on file   Years of education: Not on file   Highest education level: Not on file  Occupational History   Not on file  Tobacco Use   Smoking status: Never    Passive exposure: Never   Smokeless tobacco: Not on file  Vaping Use   Vaping status: Never Used  Substance and Sexual Activity   Alcohol use: Yes   Drug use: No   Sexual activity: Not Currently  Other Topics Concern   Not on file  Social History Narrative   Not on file   Social Drivers of Health   Financial Resource Strain: Low Risk  (10/15/2022)   [facility]   Overall Financial Resource Strain (CARDIA)    Difficulty of Paying Living Expenses: Not hard at all  Food Insecurity: Low Risk  (09/18/2023)   Received from [facility]   Hunger Vital Sign    Within the past 12 months, you worried that your food would run out before you got money to buy more: Never true    Within the past 12 months, the food you bought just didn't last and you didn't  have money to get more. : Never true  Transportation Needs: No Transportation Needs (09/18/2023)   Received from [facility]    In the past 12 months, has lack of reliable transportation kept you from medical appointments, meetings, work or from getting things needed for daily living? : No  Physical Activity: Not on file  Stress: No Stress Concern Present (02/08/2020)   Received from [facility] - Occupational Stress Questionnaire    Feeling of Stress : Only a little  Social Connections: Unknown (06/10/2021)   Received from [facility]    Social Network    Social Network: Not on file  Intimate Partner Violence: Unknown (05/15/2021)   Received from [facility]   HITS    Physically Hurt: Not on file    Insult or Talk Down To: Not on file    Threaten Physical Harm: Not on file    Scream or Curse: Not on file     Allergies  Allergen Reactions   Sulfa Antibiotics Shortness Of Breath and Other (See Comments)   Doxycycline  Other (See Comments)    Terrible headache  Other reaction(s): Other (See Comments)  Terrible headache   Hyoscyamine Other (See Comments)    Note Hyoscyamine has sulfa-like components and patient has an allergy to sulfa drugs and sulfa-based antibiotics.  Other reaction(s): Unknown  Note Hyoscyamine has sulfa-like components and patient has an allergy to sulfa drugs and sulfa-based antibiotics.   Lovastatin Other (See Comments)   Penicillins Other (See Comments)    Unknown reaction as a child   Sumatriptan      Other Reaction(s): Drowsy   Esomeprazole  Other (See Comments) and Nausea And Vomiting    Other reaction(s): Abdominal pain  Other reaction(s): Abdominal pain  Other reaction(s): Abdominal pain     Outpatient Medications Prior to Visit  Medication Sig Dispense Refill   albuterol  (VENTOLIN  HFA) 108 (90 Base) MCG/ACT inhaler Inhale 2 puffs into the lungs every 6 (six) hours as needed for wheezing or shortness of breath.     ALPRAZolam  (XANAX ) 0.25 MG tablet Take 0.25 mg by mouth 2 (two) times daily as needed for anxiety.     amitriptyline (ELAVIL) 10 MG tablet Take 10 mg by mouth at bedtime.     cetirizine (ZYRTEC) 10 MG tablet Take 10 mg by mouth daily.     Evolocumab 140 MG/ML SOAJ Inject 140 mg into the skin every 14 (fourteen) days.     pantoprazole  (PROTONIX ) 40 MG tablet Take 1 tablet (40 mg total) by mouth daily. Take 30-60 min before first meal of the day 30 tablet 2   SUMAtriptan  (IMITREX ) 50 MG tablet Take 1 tablet (50 mg total) by mouth every 2 (two) hours as needed for migraine.  May repeat in 2 hours if headache persists or recurs. 10 tablet 3   No facility-administered medications prior to visit.    ROS Reviewed all systems and reported negative except as above     Objective:   Vitals:   11/25/23 1315  BP: (!) 161/105  Pulse: 72  Temp: 98.3 F (36.8 C)  TempSrc: Oral  SpO2: 97%  Weight: 177 lb 3.2 oz (80.4 kg)  Height: 5' 6 (1.676 m)    Physical Exam Constitutional:      Appearance: Normal appearance.  HENT:     Head: Normocephalic and atraumatic.     Nose: Nose normal.     Mouth/Throat:     Mouth: Mucous  membranes are moist.  Cardiovascular:     Rate and Rhythm: Normal rate and regular rhythm.     Pulses: Normal pulses.  Pulmonary:     Effort: Pulmonary effort is normal.  Abdominal:     General: Abdomen is flat.  Musculoskeletal:        General: Normal range of motion.  Skin:    General: Skin is warm.     Capillary Refill: Capillary refill takes less than 2 seconds.  Neurological:     General: No focal deficit present.     Mental Status: He is alert.    Physical Exam      CBC    Component Value Date/Time   WBC 6.0 08/11/2023 1033   WBC 11.5 (H) 03/02/2023 0256   RBC 5.40 08/11/2023 1033   RBC 5.27 03/02/2023 0256   HGB 15.7 08/11/2023 1033   HCT 48.7 08/11/2023 1033   PLT 322 08/11/2023 1033   MCV 90 08/11/2023 1033   MCV 90 110/04/15 0540   MCH 29.1 08/11/2023 1033   MCH 29.8 03/02/2023 0256   MCHC 32.2 08/11/2023 1033   MCHC 35.6 03/02/2023 0256   RDW 14.0 08/11/2023 1033   RDW 13.0 110/04/15 0540   LYMPHSABS 1.1 03/02/2023 0256   LYMPHSABS 1.8 01/22/2021 1554   MONOABS 1.3 (H) 03/02/2023 0256   EOSABS 0.2 03/02/2023 0256   EOSABS 0.2 01/22/2021 1554   BASOSABS 0.0 03/02/2023 0256   BASOSABS 0.1 01/22/2021 1554     Chest imaging:  PFT:     No data to display          Labs:    Echo:       Assessment & Plan:   Assessment and Plan Assessment & Plan Pulmonary nodules with new diffuse  micronodules after recent inhalational exposure 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 Reactive lymph nodes suggest atypical infection or exposure rather than malignancy. Old nodules unchanged. Differential includes exposure-related inflammation versus infection. - Order repeat chest CT in three months to assess resolution of micronodules. - Educated on the importance of wearing a mask to prevent further exposure. - Discussed potential need for bronchoscopy if nodules persist or symptoms worsen.  Subacute or chronic sinusitis Persistent sinus congestion and headaches post-exposure to dust and debris, suggestive of sinusitis, possibly bacterial. Discussed potential bacterial infection and treatment options. Prescribed cefpodoxime due to allergies. - Order complete blood count to assess for infection. - Prescribe cefpodoxime for seven days due to penicillin and sulfa allergies.  Asthma Asthma with rare use of albuterol  inhaler, approximately once or twice a month. No nocturnal symptoms. Previous severe reaction to albuterol  during acute illness. Current management with rescue inhaler appears adequate. - Consider escalation to a maintenance inhaler with a steroid if symptoms worsen.  History of environmental and occupational lung exposures (welding, burn pits, dust) Significant environmental and occupational exposures, including welding and burn pits. CT findings of calcified lymph nodes consistent with prior exposures. Current work as a Academic librarian with recent exposure to dust and debris. - Advise on minimizing exposure to dust and debris in current occupation. - Encourage continued use of protective masks during work.  Allergy to penicillins and sulfa drugs Allergies to penicillins and sulfa drugs, with severe reaction to sulfa drugs. Cefpodoxime considered a safer alternative due to 2% cross-reactivity with penicillin. - Prescribe cefpodoxime as  an alternative antibiotic due to penicillin and sulfa allergies.        Zola Herter, MD [facility] Office: [phone]

## 2023-11-25 NOTE — Patient Instructions (Signed)
  VISIT SUMMARY: Devon Burch, you came in today due to chest pain and respiratory symptoms following exposure to dust and debris at your new job. We discussed your recent CT scan results, which showed new tiny nodules in your lungs and changes in your lymph nodes. You also reported ongoing sinus congestion, headaches, and general malaise. We reviewed your asthma management and your history of significant environmental exposures.  YOUR PLAN: -PULMONARY NODULES WITH NEW DIFFUSE MICRONODULES: You have new tiny nodules in your lungs likely due to recent exposure to dust and debris at work. These nodules are not cancerous but may be due to inflammation or infection. We will repeat a chest CT scan in three months to see if the nodules resolve. It's important to continue wearing a mask to prevent further exposure. If the nodules persist or your symptoms worsen, we may need to perform a bronchoscopy, a procedure to look inside your lungs.  -SUBACUTE OR CHRONIC SINUSITIS: Your persistent sinus congestion and headaches are likely due to sinusitis, which may be a bacterial infection. We will check your blood to see if there is an infection and have prescribed cefpodoxime, an antibiotic, for seven days to treat it.  -ASTHMA: Your asthma is currently managed with a rescue inhaler that you use rarely. If your symptoms worsen, we may consider adding a maintenance inhaler with a steroid to better control your asthma.  -HISTORY OF ENVIRONMENTAL AND OCCUPATIONAL LUNG EXPOSURES: You have a history of significant exposure to environmental hazards, including during your [employer] and current job. These exposures have affected your lungs. It's important to minimize further exposure to dust and debris at work and continue using protective masks.  -ALLERGY TO PENICILLINS AND SULFA DRUGS: You are allergic to penicillins and sulfa drugs, which can cause severe reactions. We have prescribed cefpodoxime as a safer alternative  antibiotic for your sinusitis.  INSTRUCTIONS: Please follow up in three months for a repeat chest CT scan to assess the resolution of the lung nodules. If your symptoms worsen or do not improve, contact our office immediately.

## 2023-11-25 NOTE — Telephone Encounter (Signed)
 Ct will be discussed at [facility] 11/25/23

## 2023-11-25 NOTE — Telephone Encounter (Addendum)
 Dr. Gregg- how do you want to proceed? Pt has not seen you since 2023. Currently scheduled for 06/22/24

## 2023-11-25 NOTE — Telephone Encounter (Signed)
 I have not seen him over 2 years and he did not follow up. Agree with the appointment in May and put him on the cancellation list.

## 2023-11-25 NOTE — Telephone Encounter (Signed)
 This patient called and requested an appointment. I scheduled him for the next available which is May and added him to the wait list. 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 He said that last time he was here there was a MRI ordered for him but it was never done-I am not sure what happened with that but I see the order is now cancelled. 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 Are you able to call him with some direction? Thanks!

## 2023-11-26 ENCOUNTER — Ambulatory Visit: Payer: Self-pay

## 2023-11-27 ENCOUNTER — Other Ambulatory Visit: Payer: Self-pay | Admitting: Acute Care

## 2023-11-27 ENCOUNTER — Other Ambulatory Visit: Payer: Self-pay | Admitting: *Deleted

## 2023-11-27 ENCOUNTER — Ambulatory Visit: Payer: Self-pay

## 2023-11-27 ENCOUNTER — Telehealth: Payer: Self-pay

## 2023-11-27 ENCOUNTER — Telehealth: Payer: Self-pay | Admitting: Family Medicine

## 2023-11-27 DIAGNOSIS — J019 Acute sinusitis, unspecified: Secondary | ICD-10-CM

## 2023-11-27 MED ORDER — AZITHROMYCIN 250 MG PO TABS: ORAL_TABLET | ORAL | 0 refills | Status: DC

## 2023-11-27 MED ORDER — AZITHROMYCIN 250 MG PO TABS: 250.0000 mg | ORAL_TABLET | ORAL | 0 refills | Status: DC

## 2023-11-27 MED ORDER — LEVOFLOXACIN 750 MG PO TABS: 750.0000 mg | ORAL_TABLET | Freq: Every day | ORAL | 0 refills | Status: DC

## 2023-11-27 NOTE — Telephone Encounter (Signed)
 Copied from CRM [id number]. Topic: Clinical - Prescription Issue >> Nov 27, 2023  1:50 PM Nathanel DEL wrote: Reason for CRM: pt called b/c he was told [facility] [place]  However it states the Rx was printed .  So [facility]. Pt states it is very important he get this today, as he was given another med earlier for his issue,  and had an allergic reaction. Can you re send to  [id number] [place] [place] - [address]

## 2023-11-27 NOTE — Telephone Encounter (Signed)
 Sent in RX for Z pack. He has allergies to PCN, Doxy and Sulfa drugs. He had allergic reaction to cefotaxime , with tongue swelling and shortness of breath. This resolved with Benadryl  . He was to stop medication. New RX sent, Please tell patient , For any tongue swelling, shortness of breath he needs to go to the ER and seek emergency care.

## 2023-11-27 NOTE — Telephone Encounter (Signed)
 This Triage RN spoke with S.Groce, NP and informed her of the situation at hand according to previously noted information in the CRM communications and notes. The provider stated she needed to talk to the pharmacy. This Triage RN provided the contact information to the provider at this time, and no further concerns or requests were voiced.

## 2023-11-27 NOTE — Telephone Encounter (Signed)
 I re sent his zpack  Apologized for the inconvenience  Nothing further needed

## 2023-11-27 NOTE — Telephone Encounter (Signed)
 Routing to provider of the day per protocol

## 2023-11-27 NOTE — Telephone Encounter (Signed)
 Per Dr. Johnston Secure chat and please see last telephone encounter this medication was asked to be sent in     [facility] Can you please place an order for levaquin 750 mg daily for 7 days to be sent to his pharmacy.

## 2023-11-27 NOTE — Telephone Encounter (Signed)
 Copied from CRM [id number]. Topic: Clinical - Prescription Issue >> Nov 27, 2023  1:50 PM Nathanel DEL wrote: Reason for CRM: pt called b/c he was told [facility] [place]  However it states the Rx was printed .  So [facility]. Pt states it is very important he get this today, as he was given another med earlier for his issue,  and had an allergic reaction. Can you re send to  [id number] [place], [place] - [address] >> Nov 27, 2023  2:31 PM Leila BROCKS wrote: Patient [phone] states antibiotics has missing information, [facility]. Patient states need medication today before the office closes. Per CAL, CMA is unavailabe send a crm as high priority before the office closes. Contact NT, warm transferred to NT.   [id number] [place] [place] - [address] Phone: [phone]  Fax: [phone]  Answer Assessment - Initial Assessment Questions 1. NAME of MEDICINE: What medicine(s) are you calling about?     azithromycin (ZITHROMAX) 250 MG tablet 2. QUESTION: What is your question? (e.g., double dose of medicine, side effect)     [facility] needing additional information, so pt calling to get med clarified. 3. PRESCRIBER: Who prescribed the medicine? Reason: if prescribed by specialist, call should be referred to that group.     LBPU 4. SYMPTOMS: Do you have any symptoms? If Yes, ask: What symptoms are you having?  How bad are the symptoms (e.g., mild, moderate, severe)     N/a 5. PREGNANCY:  Is there any chance that you are pregnant? When was your last menstrual period?     N/a  Protocols used: Medication Question Call-A-AH

## 2023-11-27 NOTE — Telephone Encounter (Signed)
 FYI Only or Action Required?: Action required by provider: clinical question for provider.  Patient is followed in Pulmonology for New Patient, last seen on 11/25/2023 by Hattar, Zola SAILOR, MD.  Called Nurse Triage reporting Allergic Reaction.  Symptoms began yesterday.  Interventions attempted: Other: Patient will not take any further doses of Vantin.  Symptoms are: Resolved.  Triage Disposition: Call PCP Now  Patient/caregiver understands and will follow disposition?: No, wishes to speak with PCP  **See note below**              Copied from CRM #[id number]. Topic: Clinical - Red Word Triage >> Nov 27, 2023  9:27 AM Leila BROCKS wrote: Red Word that prompted transfer to Nurse Triage: Patient [phone] states had an allergic reaction to cefotaxime (VANTIN) 200 MG tablet last night. Patient states tongue swelling, both legs pain more so on left leg, shortness of breath, and toke Benadryl  last night. Patient denies fever. Patient sent a message to Dr. Zaida yesterday via Mychart. Please advise.    [id number] [place], [place] - [address] [place] [address] Phone: [phone] Fax: [phone] Reason for Disposition  [1] Caller requests to speak ONLY to PCP AND [2] URGENT question  Answer Assessment - Initial Assessment Questions 1. ONSET: When did the swelling start? (e.g., minutes, hours, days)      Last night   2. LOCATION: What part of the tongue is swollen?     Whole tongue   3. SEVERITY: How swollen is it?      Pt. Is able to swallow, symptoms have now resolved   4. CAUSE: What do you think is causing the tongue swelling? (e.g., history of angioedema, allergies)     Cefotaxime, patient stated on the medication yesterday, has only taken 2 pills so far.    5. RECURRENT SYMPTOM: Have you had tongue swelling before? If Yes, ask: When was the last time? What happened that time?     No    6. OTHER SYMPTOMS: Do you  have any other symptoms? (e.g., breathing difficulty, facial swelling) No   Patient started on cefotaxime on 10/15 for sinus infection. Patient states yesterday he experienced tongue swelling, BIL  leg pain more so on left leg, shortness of breath, and toke Benadryl  last night. All symptoms have resolved; wants provider to be aware, and prescribe a different medication for the sinus infection.  Protocols used: Tongue Swelling-A-AH, PCP Call - No Triage-A-AH

## 2023-11-27 NOTE — Telephone Encounter (Signed)
 I called and spoke with the pt and notified of response from Sarah. She verbalized understanding. Nothing further needed.

## 2023-11-27 NOTE — Telephone Encounter (Signed)
**Note De-identified  [place] Obfuscation** Please advise 

## 2023-11-30 ENCOUNTER — Telehealth: Payer: Self-pay | Admitting: *Deleted

## 2023-11-30 NOTE — Telephone Encounter (Signed)
 Spoke with the pt and he received rx. Nothing further needed.

## 2023-11-30 NOTE — Telephone Encounter (Signed)
 Copied from CRM [id number]. Topic: Clinical - Prescription Issue >> Nov 27, 2023  1:50 PM Nathanel DEL wrote: Reason for CRM: pt called b/c he was told [facility] [place]  However it states the Rx was printed .  So [facility]. Pt states it is very important he get this today, as he was given another med earlier for his issue,  and had an allergic reaction. Can you re send to  [id number] [place], [place] - [address] >> Nov 27, 2023  3:18 PM Benton KIDD wrote: [facility] about the [facility] . No one left a name . Ms kyra says the prescription was incorrect the way it was written and they resent a e scribe for clarification at 2:12 pm with no reply . >> Nov 27, 2023  2:31 PM Leila BROCKS wrote: Patient [phone] states antibiotics has missing information, [facility]. Patient states need medication today before the office closes. Per CAL, CMA is unavailabe send a crm as high priority before the office closes. Contact NT, warm transferred to NT.   [id number] [place], [place] - [address] Phone: [phone]  Fax: [phone]   Duplicate message.  See previous note from today.  This has been handled.  This was handled while he was at the pharmacy on Friday 10/17 per [place].  Nothing further needed.

## 2023-11-30 NOTE — Telephone Encounter (Signed)
 Copied from CRM [id number]. Topic: Clinical - Prescription Issue >> Nov 27, 2023  1:50 PM Nathanel DEL wrote: Reason for CRM: pt called b/c he was told [facility] [place]  However it states the Rx was printed .  So [facility]. Pt states it is very important he get this today, as he was given another med earlier for his issue,  and had an allergic reaction. Can you re send to  [id number] [place], [place] - [address] >> Nov 27, 2023  3:18 PM Benton KIDD wrote: [facility] about the [facility] . No one left a name . Ms kyra says the prescription was incorrect the way it was written and they resent a e scribe for clarification at 2:12 pm with no reply . >> Nov 27, 2023  2:31 PM Leila BROCKS wrote: Patient [phone] states antibiotics has missing information, [facility]. Patient states need medication today before the office closes. Per CAL, CMA is unavailabe send a crm as high priority before the office closes. Contact NT, warm transferred to NT.   [id number] [place], [place] - [address] Phone: [phone]  Fax: [phone]   Initial prescription did print by mistake.  Another script was sent in the same day, 10/17 by the provider and it did go through.  I called the pharmacy that it was received, filled and picked up on 10/17.    I called the patient x1.  Left detailed VM per DPR.  Requested a return call to verify he received his medication and this message.

## 2023-12-08 ENCOUNTER — Encounter (HOSPITAL_BASED_OUTPATIENT_CLINIC_OR_DEPARTMENT_OTHER): Payer: Self-pay | Admitting: Emergency Medicine

## 2023-12-08 ENCOUNTER — Ambulatory Visit (INDEPENDENT_AMBULATORY_CARE_PROVIDER_SITE_OTHER): Admit: 2023-12-08 | Discharge: 2023-12-08 | Disposition: A | Attending: Student | Admitting: Radiology

## 2023-12-08 ENCOUNTER — Ambulatory Visit (HOSPITAL_BASED_OUTPATIENT_CLINIC_OR_DEPARTMENT_OTHER)

## 2023-12-08 ENCOUNTER — Ambulatory Visit (HOSPITAL_BASED_OUTPATIENT_CLINIC_OR_DEPARTMENT_OTHER)
Admission: EM | Admit: 2023-12-08 | Discharge: 2023-12-08 | Disposition: A | Discharge status: Home / Self Care | Attending: Student | Admitting: Student

## 2023-12-08 DIAGNOSIS — M25562 Pain in left knee: Secondary | ICD-10-CM

## 2023-12-08 DIAGNOSIS — M7652 Patellar tendinitis, left knee: Secondary | ICD-10-CM | POA: Diagnosis not present

## 2023-12-08 NOTE — ED Provider Notes (Signed)
 Devon Burch CARE    CSN: [id number] Arrival date & time: 12/08/23  0800     History   Chief Complaint Chief Complaint  Patient presents with   Knee Pain    HPI Devon Burch is a 48 y.o. male presenting with nontraumatic left knee pain, present for about 2 days.  He has a history of arthritis, gout, patellar tendonitis, per patient both of his meniscus's have been removed many years ago.  He denies trauma to the area.  Has been using ibuprofen and a knee brace.  He was seen by [facility] for nontraumatic right foot pain on 11/28/2023, per their note: - acute atraumatic right foot pain localized mainly between 4th and 5th metatarsal heads  - suspect due to morton's neuroma vs other neurogenic pain   Plan:  - will proceed with conservative management  - short course of oral meloxicam ; avoid other oral anti-inflammatories while taking  - offered post-op shoe but patient politely declined. He has a tall CAM boot at home that he may wear if needed for pain  - metatarsal pads  - rest/activity modification  - ice  - compression  - elevation   HPI  Past Medical History:  Diagnosis Date   Anxiety    GERD (gastroesophageal reflux disease)     Patient Active Problem List   Diagnosis Date Noted   Myalgia 08/11/2023   Strain of lumbar region 06/01/2023   Irritable bowel syndrome 03/11/2023   Benign neoplasm of colon 03/11/2023   Condition influencing health status 03/11/2023   Lumbar sprain 03/11/2023   Degenerative arthritis 03/11/2023   Health examination of defined subpopulation 03/11/2023   Pain in joint, lower leg 03/11/2023   Epistaxis 03/11/2023   Supraventricular tachycardia 03/11/2023   Familial hypercholesterolemia 11/28/2022   Dyspepsia 04/08/2022   NASH (nonalcoholic steatohepatitis) 01/17/2022   Chronic idiopathic gout involving toe of right foot without tophus 01/22/2021   Patellar tendinitis of left knee 01/16/2021   Right upper lobe pulmonary  nodule 09/14/2020   Chronic headache disorder 09/13/2020   Abnormal liver function tests 09/13/2020   Gouty arthritis 09/13/2020   Acute bilateral low back pain without sciatica 09/10/2020   Acne vulgaris 08/30/2020   Abdominal pain 08/30/2020   Arthralgia of knee 08/30/2020   Adjustment disorder with anxiety 08/30/2020   Continuous nicotine dependence 08/30/2020   Dysphagia 08/30/2020   Difficulty breathing 08/30/2020   Derangement of posterior horn of medial meniscus 08/30/2020   Folliculitis 08/30/2020   Gout 08/30/2020   Migraine, unspecified, not intractable, without status migrainosus 08/30/2020   Familial hyperlipidemia 08/30/2020   Hx of colonic polyps 08/30/2020   Hematuria 08/30/2020   Palpitations 08/30/2020   Pain, abdominal, epigastric 08/30/2020   Pain with urination 08/30/2020   Pain of anterior chest wall with respiration 08/30/2020   Sprain, ribs 08/30/2020   Sleep apnea, unspecified 08/30/2020   Skin rash 08/30/2020   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   Tonsillitis 08/30/2020   Tobacco use 08/30/2020   Internal derangement of right knee 08/30/2020   Vitamin D deficiency 08/30/2020   Peripheral tear of medial meniscus of right knee as current injury 06/21/2019   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   Weakness of left hand 05/09/2019   Crushing injury of left forearm 04/15/2019   Wrist pain 04/01/2019   Hand injury 03/18/2019   Unilateral primary osteoarthritis, right knee 09/14/2018   Left ankle pain 03/17/2018   Right foot pain 03/17/2018   Esophageal reflux 05/26/2016   Depression, unspecified 03/18/2016  Anxiety disorder, unspecified 03/18/2016   Acute ethmoidal sinusitis, unspecified 01/02/2016   Acute bronchitis 07/20/2014   Neck pain 05/25/2014   Primary thunderclap headache 05/25/2014   Perennial allergic rhinitis with seasonal variation 05/09/2014   Unspecified intracranial injury with loss of consciousness status unknown, initial encounter (HCC)  05/09/2014   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 Authorizing Provider  amitriptyline (ELAVIL) 10 MG tablet Take 10 mg by mouth at bedtime.   Yes [provider]  Evolocumab 140 MG/ML SOAJ Inject 140 mg into the skin every 14 (fourteen) days.   Yes [provider]  albuterol  (VENTOLIN  HFA) 108 (90 Base) MCG/ACT inhaler Inhale 2 puffs into the lungs every 6 (six) hours as needed for wheezing or shortness of breath. 01/05/19   [provider]  ALPRAZolam  (XANAX ) 0.25 MG tablet Take 0.25 mg by mouth 2 (two) times daily as needed for anxiety. 04/01/23   [provider]  azithromycin (ZITHROMAX) 250 MG tablet Take 2 tablets today, and 1 tablet for the next 4 days until gone. For any tongue swelling or shortness of breath seeks emergency care immediately 11/27/23   Ruthell Lauraine FALCON, NP  azithromycin (ZITHROMAX) 250 MG tablet Take 1 tablet (250 mg total) by mouth as directed. 11/27/23   Ruthell Lauraine FALCON, NP  cetirizine (ZYRTEC) 10 MG tablet Take 10 mg by mouth daily. 08/30/21   [provider]  pantoprazole  (PROTONIX ) 40 MG tablet Take 1 tablet (40 mg total) by mouth daily. Take 30-60 min before first meal of the day 05/25/12   Darlean Ozell NOVAK, MD  SUMAtriptan  (IMITREX ) 50 MG tablet Take 1 tablet (50 mg total) by mouth every 2 (two) hours as needed for migraine. May repeat in 2 hours if headache  persists or recurs. 05/08/21   Gregg Lek, MD    Family History Family History  Problem Relation Age of Onset   Emphysema Paternal Grandmother    Heart disease Paternal Grandfather    Heart disease Maternal Grandfather     Social History Social History   Tobacco Use   Smoking status: Never    Passive exposure: Never  Vaping Use   Vaping status: Never Used  Substance Use Topics   Alcohol use: Yes   Drug use: No     Allergies   Sulfa antibiotics, Doxycycline , Hyoscyamine, Lovastatin, Penicillins, Sumatriptan , and Esomeprazole    Review of Systems Review of Systems  Constitutional:  Negative for chills and fever.  HENT:  Negative for ear pain and sore throat.   Eyes:  Negative for pain and visual disturbance.  Respiratory:  Negative for cough and shortness of breath.   Cardiovascular:  Negative for chest pain and palpitations.  Gastrointestinal:  Negative for abdominal pain and vomiting.  Genitourinary:  Negative for dysuria and hematuria.  Musculoskeletal:  Negative for arthralgias and back pain.       L knee pain   Skin:  Negative  for color change and rash.  Neurological:  Negative for seizures and syncope.  All other systems reviewed and are negative.    Physical Exam Triage Vital Signs ED Triage Vitals 12/08/23 0820  Encounter Vitals Group     BP      Girls Systolic BP Percentile      Girls Diastolic BP Percentile      Boys Systolic BP Percentile      Boys Diastolic BP Percentile      Pulse      Resp      Temp      Temp src      SpO2      Weight      Height      Head Circumference      Peak Flow      Pain Score 7     Pain Loc      Pain Education      Exclude from Growth Chart    No data found.  Updated Vital Signs BP 136/84 (BP Location: Right Arm)   Pulse 77   Temp 98.1 F (36.7 C) (Oral)   Resp 18   SpO2 95%   Visual Acuity Right Eye Distance:   Left Eye Distance:   Bilateral Distance:    Right Eye Near:   Left Eye Near:     Bilateral Near:     Physical Exam Vitals reviewed.  Constitutional:      General: He is not in acute distress.    Appearance: Normal appearance. He is not ill-appearing.  HENT:     Head: Normocephalic and atraumatic.  Pulmonary:     Effort: Pulmonary effort is normal.  Musculoskeletal:     Comments: Knees are equal and symmetric, with no effusion.  Knees measure 37 cm bilaterally.  There is no point tenderness.  There is no calf tenderness.  Calves are equal and symmetric, there is no venous distention.  Left knee with stiffness and crepitus with extension.  Range of motion limited due to stiffness.  There is no joint laxity.  Popliteal pulse 2+  Neurological:     General: No focal deficit present.     Mental Status: He is alert and oriented to person, place, and time.  Psychiatric:        Mood and Affect: Mood normal.        Behavior: Behavior normal.        Thought Content: Thought content normal.        Judgment: Judgment normal.      UC Treatments / Results  Labs (all labs ordered are listed, but only abnormal results are displayed) Labs Reviewed - No data to display  EKG   Radiology DG Knee Complete 4 Views Left Result Date: 12/08/2023 CLINICAL DATA:  Nontraumatic LEFT knee pain. EXAM: LEFT KNEE - COMPLETE 4+ VIEW COMPARISON:  None available FINDINGS: Mild osteopenia. Joint spaces maintained. No acute osseous abnormality. Mild thickening of the patellar tendon. IMPRESSION: 1. Mild thickening of the patellar tendon possibly due to tendonitis. 2. Mild osteopenia. Electronically Signed   By: Aliene Lloyd M.D.   On: 12/08/2023 09:08    Procedures Procedures (including critical care time)  Medications Ordered in UC Medications - No data to display  Initial Impression / Assessment and Plan / UC Course  I have reviewed the triage vital signs and the nursing notes.  Pertinent labs & imaging results that were available during my care of the patient were reviewed by me  and considered in my medical decision making (see chart for details).     Patient is a pleasant 48 year old male presenting with nontraumatic left patellar tendonitis.  He is neurovascularly intact.  Xray L knee: 1. Mild thickening of the patellar tendon possibly due to tendonitis. 2. Mild osteopenia.  Wells score for DVT -2.  He already has a knee brace. He will start the steroid taper that has already been prescribed. F/u with [facility], where he is already a patient.   Final Clinical Impressions(s) / UC Diagnoses   Final diagnoses:  Acute pain of left knee  Patellar tendonitis of left knee     Discharge Instructions      - Start the steroid pack that you have at home already. -While you take the steroid, avoid NSAIDs like ibuprofen and Aleve, but you can take Tylenol  1000 mg up to 3 times daily. -Rest, ice, elevation, knee brace - Schedule a follow-up with [facility] for 1 week     ED Prescriptions   None    PDMP not reviewed this encounter.   Arlyss Leita BRAVO, PA-C 12/08/23 [phone]

## 2023-12-08 NOTE — ED Triage Notes (Addendum)
 Pt reports when he woke up yesterday morning he had left knee pain. Last week he had right foot pain that radiated up his leg and it lasted about 3-4 days and went away. Pt denies any injury

## 2023-12-08 NOTE — Discharge Instructions (Addendum)
-   Start the steroid pack that you have at home already. -While you take the steroid, avoid NSAIDs like ibuprofen and Aleve, but you can take Tylenol  1000 mg up to 3 times daily. -Rest, ice, elevation, knee brace - Schedule a follow-up with [facility] for 1 week

## 2024-01-12 ENCOUNTER — Other Ambulatory Visit (HOSPITAL_BASED_OUTPATIENT_CLINIC_OR_DEPARTMENT_OTHER): Payer: Self-pay | Admitting: Internal Medicine

## 2024-01-12 DIAGNOSIS — Z0189 Encounter for other specified special examinations: Secondary | ICD-10-CM

## 2024-01-12 DIAGNOSIS — R1012 Left upper quadrant pain: Secondary | ICD-10-CM

## 2024-01-13 ENCOUNTER — Inpatient Hospital Stay (INDEPENDENT_AMBULATORY_CARE_PROVIDER_SITE_OTHER): Admission: RE | Admit: 2024-01-13 | Discharge: 2024-01-13 | Attending: Internal Medicine | Admitting: Internal Medicine

## 2024-01-13 DIAGNOSIS — Z0189 Encounter for other specified special examinations: Secondary | ICD-10-CM

## 2024-01-13 DIAGNOSIS — R1012 Left upper quadrant pain: Secondary | ICD-10-CM

## 2024-01-13 MED ORDER — IOHEXOL 300 MG/ML  SOLN
100.0000 mL | Freq: Once | INTRAMUSCULAR | Status: AC | PRN
  Administered 2024-01-13: 100 mL via INTRAVENOUS

## 2024-01-26 ENCOUNTER — Telehealth: Payer: Self-pay | Admitting: Cardiology

## 2024-01-26 NOTE — Telephone Encounter (Signed)
 Sent message to front desk to make sooner appt for pt. To see Dr. Krasowski. For irreg. Heart rate

## 2024-01-26 NOTE — Telephone Encounter (Signed)
 Patient c/o Palpitations: STAT if patient c/o lightheadedness, shortness of breath, or chest pain  How long have you had palpitations/irregular HR/ Afib? Are you having the symptoms now? Over a month   Are you currently experiencing lightheadedness, SOB or CP? no   Do you have a history of afib (atrial fibrillation) or irregular heart rhythm? yes  Have you checked your BP or HR? (document readings if available): no   Are you experiencing any other symptoms?  Just a cough   Please advise.

## 2024-01-26 NOTE — Telephone Encounter (Signed)
 Pt returning call. Please advise.

## 2024-01-26 NOTE — Telephone Encounter (Signed)
 Has appt scheduled Monday Dec 22 @ 8am

## 2024-02-01 ENCOUNTER — Encounter: Payer: Self-pay | Admitting: Cardiology

## 2024-02-01 ENCOUNTER — Ambulatory Visit: Payer: Self-pay | Attending: Cardiology | Admitting: Cardiology

## 2024-02-01 VITALS — BP 122/80 | HR 76 | Ht 66.0 in | Wt 182.5 lb

## 2024-02-01 DIAGNOSIS — R002 Palpitations: Secondary | ICD-10-CM

## 2024-02-01 DIAGNOSIS — I471 Supraventricular tachycardia, unspecified: Secondary | ICD-10-CM

## 2024-02-01 DIAGNOSIS — E78019 Familial hypercholesterolemia, unspecified: Secondary | ICD-10-CM

## 2024-02-01 MED ORDER — METOPROLOL TARTRATE 25 MG PO TABS: 25.0000 mg | ORAL_TABLET | Freq: Two times a day (BID) | ORAL | 3 refills | Status: AC

## 2024-02-01 NOTE — Progress Notes (Signed)
 " Cardiology Office Note:    Date:  02/01/2024   ID:  Devon Burch, DOB [birth year], MRN [id number]  PCP:  Rosalva Doffing, NP  Cardiologist:  Lamar Fitch, MD    Referring MD: Rosalva Doffing, NP   No chief complaint on file. I have more palpitations  History of Present Illness:    Devon Burch is a 48 y.o. male past medical history significant for palpitation, history of supraventricular tachycardia, however so far only 1 documented episode of supraventricular tachycardia when he was sick with upper respiratory tract infection, he was giving a lot of bronchodilators and up having supraventricular tachycardia that required adenosine .  He was put on Cardizem but not much response to it, also family hyperlipidemia on Repatha however recently unable to afford it.  Comes today to months for follow-up complain of having palpitations he feels sometimes his heart stopping for a moment and speeding up.  But no sustained arrhythmia like he had in at the beginning of the year when he required cardioversion.  Very alarmed about the way he feels.  Past Medical History:  Diagnosis Date   Abdominal pain 08/30/2020   Abnormal liver function tests 09/13/2020   Acne vulgaris 08/30/2020   Acute bilateral low back pain without sciatica 09/10/2020   Acute bronchitis 07/20/2014   Acute ethmoidal sinusitis, unspecified 01/02/2016   Adjustment disorder with anxiety 08/30/2020   Adjustment disorder with mixed anxiety and depressed mood 08/02/2013   Anxiety disorder, unspecified 03/18/2016   Arthralgia of knee 08/30/2020   Barrett esophagus 08/02/2013   Benign neoplasm of colon 03/11/2023   Chronic headache disorder 09/13/2020   Chronic idiopathic gout involving toe of right foot without tophus 01/22/2021   Condition influencing health status 03/11/2023   Continuous nicotine dependence 08/30/2020   Cough 05/28/2012   Followed in Pulmonary clinic/ [facility]/ [place]      Crushing injury of left  forearm 04/15/2019   Description:     Degenerative arthritis 03/11/2023   May 30, 2022 Entered By: BEVERLEY PARRY T Comment: left foot     Depression, unspecified 03/18/2016   Derangement of posterior horn of medial meniscus 08/30/2020   Difficulty breathing 08/30/2020   Dyspepsia 04/08/2022   Epigastric and LUQ pain     Dysphagia 08/30/2020   Esophageal reflux 05/26/2016   Familial hypercholesterolemia 11/28/2022   Familial hyperlipidemia 08/30/2020   Folliculitis 08/30/2020   Gout 08/30/2020   Gouty arthritis 09/13/2020   Hand injury 03/18/2019   Description:     Health examination of defined subpopulation 03/11/2023   Hematuria 08/30/2020   Hemoptysis 05/28/2012   Followed in Pulmonary clinic/ [facility]/ [place]      Hx of colonic polyps 08/30/2020   Internal derangement of right knee 08/30/2020   Irritable bowel syndrome 03/11/2023   Left ankle pain 03/17/2018   Description:     Lumbar sprain 03/11/2023   Migraine without aura and without status migrainosus, not intractable 08/16/2010   Migraine, unspecified, not intractable, without status migrainosus 08/30/2020   Mild intermittent asthma without complication 08/16/2010   NASH (nonalcoholic steatohepatitis) 01/17/2022   Fibrosure with F3 12/2021     Neck pain 05/25/2014   Pain in joint, lower leg 03/11/2023   Pain of anterior chest wall with respiration 08/30/2020   Pain with urination 08/30/2020   Pain, abdominal, epigastric 08/30/2020   Palpitations 08/30/2020   Patellar tendinitis of left knee 01/16/2021   Perennial allergic rhinitis with seasonal variation 05/09/2014   Peripheral tear of medial meniscus  of right knee as current injury 06/21/2019   Primary thunderclap headache 05/25/2014   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   Description:     Reflux esophagitis 08/16/2010   Right upper lobe pulmonary nodule 09/14/2020   4 mm - CT 09/13/20     Skin rash 08/30/2020   Sleep apnea, unspecified 08/30/2020    Sprain, ribs 08/30/2020   Strain of lumbar region 06/01/2023   Supraventricular tachycardia 03/11/2023   Terminal ileitis without complication (HCC) 08/02/2013   Tonsillitis 08/30/2020   Unilateral primary osteoarthritis, right knee 09/14/2018    Past Surgical History:  Procedure Laterality Date   HEMORRHOID SURGERY     KNEE SURGERY      Current Medications: Active Medications[1]   Allergies:   Cefpodoxime , Penicillins, Sulfa antibiotics, Doxycycline , Hyoscyamine, Lovastatin, Sumatriptan , and Esomeprazole    Social History   Socioeconomic History   Marital status: Married    Spouse name: Not on file   Number of children: Not on file   Years of education: Not on file   Highest education level: Not on file  Occupational History   Not on file  Tobacco Use   Smoking status: Never    Passive exposure: Never   Smokeless tobacco: Not on file  Vaping Use   Vaping status: Never Used  Substance and Sexual Activity   Alcohol use: Yes   Drug use: No   Sexual activity: Not Currently  Other Topics Concern   Not on file  Social History Narrative   Not on file   Social Drivers of Health   Tobacco Use: Unknown (02/01/2024)   Patient History    Smoking Tobacco Use: Never    Smokeless Tobacco Use: Unknown    Passive Exposure: Never  Recent Concern: Tobacco Use - Medium Risk (01/15/2024)   Received from [facility]   Patient History    Smoking Tobacco Use: Never    Smokeless Tobacco Use: Former    Passive Exposure: Current  Physicist, Medical Strain: Low Risk (10/15/2022)   Received from [facility]   Overall Financial Resource Strain (CARDIA)    Difficulty of Paying Living Expenses: Not hard at all  Food Insecurity: Low Risk (01/16/2024)   Received from [facility]   Epic    Within the past 12 months, you worried that your food would run out before you got money to buy more: Never true    Within the past 12 months, the food you bought just didn't last and you didn't  have money to get more. : Never true  Transportation Needs: No Transportation Needs (01/16/2024)   Received from [facility]    In the past 12 months, has lack of reliable transportation kept you from medical appointments, meetings, work or from getting things needed for daily living? : No  Physical Activity: Not on file  Stress: Not on file  Social Connections: Not on file  Depression (EYV7-0): Not on file  Alcohol Screen: Not on file  Housing: Low Risk (01/16/2024)   Received from [facility]   Epic    What is your living situation today?: I have a steady place to live    Think about the place you live. Do you have problems with any of the following? Choose all that apply:: None/None on this list  Utilities: Low Risk (01/16/2024)   Received from [facility]   Utilities    In the past 12 months has the electric, gas, oil, or water company threatened to shut off  services in your home? : No  Health Literacy: Not on file     Family History: The patient's family history includes Emphysema in his paternal grandmother; Heart disease in his maternal grandfather and paternal grandfather. ROS:   Please see the history of present illness.    All 14 point review of systems negative except as described per history of present illness  EKGs/Labs/Other Studies Reviewed:    EKG Interpretation Date/Time:  Monday February 01 2024 08:08:11 EST Ventricular Rate:  76 PR Interval:  150 QRS Duration:  98 QT Interval:  390 QTC Calculation: 438 R Axis:   43  Text Interpretation: Normal sinus rhythm Incomplete right bundle branch block Cannot rule out Anterior infarct , age undetermined When compared with ECG of 11-Aug-2023 09:51, No significant change was found Confirmed by Bernie Charleston [phone]) on 02/01/2024 8:14:56 AM    Recent Labs: 03/02/2023: B Natriuretic Peptide 13.9 08/11/2023: ALT 52; BUN 12; Creatinine, Ser 1.04; Magnesium  2.4; Potassium 4.8; Sodium 139; TSH  1.290 11/25/2023: Hemoglobin 14.6; Platelets 334.0  Recent Lipid Panel    Component Value Date/Time   CHOL 200 (H) 08/11/2023 1033   TRIG 166 (H) 08/11/2023 1033   HDL 50 08/11/2023 1033   CHOLHDL 4.0 08/11/2023 1033   LDLCALC 121 (H) 08/11/2023 1033    Physical Exam:    VS:  BP 122/80   Pulse 76   Ht 5' 6 (1.676 m)   Wt 182 lb 8 oz (82.8 kg)   SpO2 99%   BMI 29.46 kg/m     Wt Readings from Last 3 Encounters:  02/01/24 182 lb 8 oz (82.8 kg)  11/25/23 177 lb 3.2 oz (80.4 kg)  08/11/23 169 lb (76.7 kg)     GEN:  Well nourished, well developed in no acute distress HEENT: Normal NECK: No JVD; No carotid bruits LYMPHATICS: No lymphadenopathy CARDIAC: RRR, no murmurs, no rubs, no gallops RESPIRATORY:  Clear to auscultation without rales, wheezing or rhonchi  ABDOMEN: Soft, non-tender, non-distended MUSCULOSKELETAL:  No edema; No deformity  SKIN: Warm and dry LOWER EXTREMITIES: no swelling NEUROLOGIC:  Alert and oriented x 3 PSYCHIATRIC:  Normal affect   ASSESSMENT:    1. Palpitations   2. Familial hypercholesterolemia, unspecified type   3. Supraventricular tachycardia    PLAN:    In order of problems listed above:  Palpitations, will try small dose of beta-blocker, will initiate metoprolol  to tartrate 25 twice daily see how he tolerated.  Ask him to let me know if he had difficulty tolerating this medication. Familiar hyperlipidemia, he ran out of insurance could not afford Repatha, but starting this new year he will have new insurance he will be able to take it asking to start taking this again and then we will recheck his fasting lipid profile Supraventricular tachycardia denies having sustained arrhythmia Anxiety and migraine, can be helped with beta-blocker as well   Medication Adjustments/Labs and Tests Ordered: Current medicines are reviewed at length with the patient today.  Concerns regarding medicines are outlined above.  Orders Placed This Encounter   Procedures   EKG 12-Lead   Medication changes: No orders of the defined types were placed in this encounter.   Signed, [place] DOROTHA Bernie, MD, [facility] 02/01/2024 8:33 AM    [facility]    [1]  Current Meds  Medication Sig   albuterol  (VENTOLIN  HFA) 108 (90 Base) MCG/ACT inhaler Inhale 2 puffs into the lungs every 6 (six) hours as needed for wheezing or shortness  of breath.   ALPRAZolam  (XANAX ) 0.25 MG tablet Take 0.25 mg by mouth 2 (two) times daily as needed for anxiety.   amitriptyline (ELAVIL) 10 MG tablet Take 10 mg by mouth at bedtime.   cetirizine (ZYRTEC) 10 MG tablet Take 10 mg by mouth daily.   Evolocumab 140 MG/ML SOAJ Inject 140 mg into the skin every 14 (fourteen) days.   famotidine (PEPCID) 40 MG tablet Take 40 mg by mouth daily.   pantoprazole  (PROTONIX ) 40 MG tablet Take 1 tablet (40 mg total) by mouth daily. Take 30-60 min before first meal of the day   SUMAtriptan  (IMITREX ) 50 MG tablet Take 1 tablet (50 mg total) by mouth every 2 (two) hours as needed for migraine. May repeat in 2 hours if headache persists or recurs.   "

## 2024-02-01 NOTE — Patient Instructions (Signed)
 Medication Instructions:  Your physician has recommended you make the following change in your medication:   Start Metoprolol  tartrate 25 mg twice daily.  *If you need a refill on your cardiac medications before your next appointment, please call your pharmacy*   Lab Work: None ordered If you have labs (blood work) drawn today and your tests are completely normal, you will receive your results only by: MyChart Message (if you have MyChart) OR A paper copy in the mail If you have any lab test that is abnormal or we need to change your treatment, we will call you to review the results.   Testing/Procedures: None ordered   Follow-Up: At [facility], you and your health needs are our priority.  As part of our continuing mission to provide you with exceptional heart care, we have created designated Provider Care Teams.  These Care Teams include your primary Cardiologist (physician) and Advanced Practice Providers (APPs -  Physician Assistants and Nurse Practitioners) who all work together to provide you with the care you need, when you need it.  We recommend signing up for the patient portal called MyChart.  Sign up information is provided on this After Visit Summary.  MyChart is used to connect with patients for Virtual Visits (Telemedicine).  Patients are able to view lab/test results, encounter notes, upcoming appointments, etc.  Non-urgent messages can be sent to your provider as well.   To learn more about what you can do with MyChart, go to [email or web address].    Your next appointment:   6 month(s)  The format for your next appointment:   In Person  Provider:   Lamar Fitch, MD    Other Instructions none  Important Information About Sugar

## 2024-02-25 ENCOUNTER — Ambulatory Visit: Admitting: Cardiology

## 2024-02-26 ENCOUNTER — Ambulatory Visit (HOSPITAL_BASED_OUTPATIENT_CLINIC_OR_DEPARTMENT_OTHER)
Admission: RE | Admit: 2024-02-26 | Discharge: 2024-02-26 | Disposition: A | Discharge status: Home / Self Care | Source: Ambulatory Visit

## 2024-02-26 DIAGNOSIS — R911 Solitary pulmonary nodule: Secondary | ICD-10-CM

## 2024-02-26 DIAGNOSIS — R918 Other nonspecific abnormal finding of lung field: Secondary | ICD-10-CM

## 2024-02-29 ENCOUNTER — Ambulatory Visit

## 2024-02-29 ENCOUNTER — Ambulatory Visit: Admitting: *Deleted

## 2024-02-29 VITALS — BP 124/80 | HR 84 | Ht 66.0 in

## 2024-02-29 DIAGNOSIS — R911 Solitary pulmonary nodule: Secondary | ICD-10-CM

## 2024-02-29 DIAGNOSIS — R918 Other nonspecific abnormal finding of lung field: Secondary | ICD-10-CM

## 2024-02-29 LAB — PULMONARY FUNCTION TEST
DL/VA % pred: 94 %
DL/VA: 4.33 ml/min/mmHg/L
DLCO cor % pred: 102 %
DLCO cor: 27.92 ml/min/mmHg
DLCO unc % pred: 102 %
DLCO unc: 27.92 ml/min/mmHg
FEF 25-75 Pre: 2.94 L/s
FEF2575-%Pred-Pre: 86 %
FEV1-%Pred-Pre: 108 %
FEV1-Pre: 4 L
FEV1FVC-%Pred-Pre: 94 %
FEV6-%Pred-Pre: 117 %
FEV6-Pre: 5.31 L
FEV6FVC-%Pred-Pre: 102 %
FVC-%Pred-Pre: 115 %
FVC-Pre: 5.37 L
Pre FEV1/FVC ratio: 74 %
Pre FEV6/FVC Ratio: 99 %
RV % pred: 118 %
RV: 2.11 L
TLC % pred: 117 %
TLC: 7.45 L

## 2024-02-29 NOTE — Patient Instructions (Signed)
" °  VISIT SUMMARY: Devon Burch, during your follow-up visit, we discussed your pulmonary nodules and reviewed your recent imaging and pulmonary function tests. The nodules in your right lung are still present but show improvement, and your lymph nodes are normal. Your pulmonary function tests are also normal. We talked about your occasional cough and past adverse reactions to albuterol .  YOUR PLAN: -PULMONARY MICRONODULES: Pulmonary micronodules are small growths in the lungs, often due to exposure to inhaled substances. Your recent CT scan shows improvement in these nodules, and your pulmonary function tests are normal. To prevent further exposure, you should wear a mask while working. We will await the official radiology read of your CT scan and notify you if there are any new findings. Avoid using albuterol  due to your previous adverse reaction, and return if your symptoms worsen.  INSTRUCTIONS: Please follow up if your symptoms worsen or if you have any concerns. We will notify you if there are any new findings from the official radiology read of your CT scan.   "

## 2024-02-29 NOTE — Progress Notes (Signed)
 "   Subjective:   PATIENT ID: Devon Burch GENDER: male DOB: 25-Sep-1975, MRN: [id number]   HPI Discussed the use of AI scribe software for clinical note transcription with the patient, who gave verbal consent to proceed.  History of Present Illness Devon Burch is a 49 year old male with a history of pulmonary nodules who presents for follow-up.  He is currently asymptomatic but has been concerned about his health over the past three months. A year ago, pulmonary nodules were identified in the right lung on a CT scan. Recent imaging shows these nodules are still present. Pulmonary function tests have returned normal results.  He experiences an occasional cough. He recalls being treated with antibiotics previously, which had to be changed due to allergies. He has an albuterol  inhaler but is hesitant to use it due to a past adverse reaction.  Approximately a year ago, he underwent cardioversion following an episode of the flu, during which excessive albuterol  use led to a heart rate of over 200 beats per minute. This required medical intervention, including cardioversion. A cardiologist has since evaluated him, noting a slight valve leak but no other significant cardiac issues.     Past Medical History:  Diagnosis Date   Abdominal pain 08/30/2020   Abnormal liver function tests 09/13/2020   Acne vulgaris 08/30/2020   Acute bilateral low back pain without sciatica 09/10/2020   Acute bronchitis 07/20/2014   Acute ethmoidal sinusitis, unspecified 01/02/2016   Adjustment disorder with anxiety 08/30/2020   Adjustment disorder with mixed anxiety and depressed mood 08/02/2013   Anxiety disorder, unspecified 03/18/2016   Arthralgia of knee 08/30/2020   Barrett esophagus 08/02/2013   Benign neoplasm of colon 03/11/2023   Chronic headache disorder 09/13/2020   Chronic idiopathic gout involving toe of right foot without tophus 01/22/2021   Condition influencing health status  03/11/2023   Continuous nicotine dependence 08/30/2020   Cough 05/28/2012   Followed in Pulmonary clinic/ [facility]/ [place]      Crushing injury of left forearm 04/15/2019   Description:     Degenerative arthritis 03/11/2023   May 30, 2022 Entered By: BEVERLEY PARRY T Comment: left foot     Depression, unspecified 03/18/2016   Derangement of posterior horn of medial meniscus 08/30/2020   Difficulty breathing 08/30/2020   Dyspepsia 04/08/2022   Epigastric and LUQ pain     Dysphagia 08/30/2020   Esophageal reflux 05/26/2016   Familial hypercholesterolemia 11/28/2022   Familial hyperlipidemia 08/30/2020   Folliculitis 08/30/2020   Gout 08/30/2020   Gouty arthritis 09/13/2020   Hand injury 03/18/2019   Description:     Health examination of defined subpopulation 03/11/2023   Hematuria 08/30/2020   Hemoptysis 05/28/2012   Followed in Pulmonary clinic/ [facility]/ [place]      Hx of colonic polyps 08/30/2020   Internal derangement of right knee 08/30/2020   Irritable bowel syndrome 03/11/2023   Left ankle pain 03/17/2018   Description:     Lumbar sprain 03/11/2023   Migraine without aura and without status migrainosus, not intractable 08/16/2010   Migraine, unspecified, not intractable, without status migrainosus 08/30/2020   Mild intermittent asthma without complication 08/16/2010   NASH (nonalcoholic steatohepatitis) 01/17/2022   Fibrosure with F3 12/2021     Neck pain 05/25/2014   Pain in joint, lower leg 03/11/2023   Pain of anterior chest wall with respiration 08/30/2020   Pain with urination 08/30/2020   Pain, abdominal, epigastric 08/30/2020   Palpitations 08/30/2020   Patellar  tendinitis of left knee 01/16/2021   Perennial allergic rhinitis with seasonal variation 05/09/2014   Peripheral tear of medial meniscus of right knee as current injury 06/21/2019   Primary thunderclap headache 05/25/2014   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   Description:     Reflux  esophagitis 08/16/2010   Right upper lobe pulmonary nodule 09/14/2020   4 mm - CT 09/13/20     Skin rash 08/30/2020   Sleep apnea, unspecified 08/30/2020   Sprain, ribs 08/30/2020   Strain of lumbar region 06/01/2023   Supraventricular tachycardia 03/11/2023   Terminal ileitis without complication (HCC) 08/02/2013   Tonsillitis 08/30/2020   Unilateral primary osteoarthritis, right knee 09/14/2018     Family History  Problem Relation Age of Onset   Emphysema Paternal Grandmother    Heart disease Paternal Grandfather    Heart disease Maternal Grandfather      Social History   Socioeconomic History   Marital status: Married    Spouse name: Not on file   Number of children: Not on file   Years of education: Not on file   Highest education level: Not on file  Occupational History   Not on file  Tobacco Use   Smoking status: Never    Passive exposure: Never   Smokeless tobacco: Not on file  Vaping Use   Vaping status: Never Used  Substance and Sexual Activity   Alcohol use: Yes   Drug use: No   Sexual activity: Not Currently  Other Topics Concern   Not on file  Social History Narrative   Not on file   Social Drivers of Health   Tobacco Use: Unknown (02/29/2024)   Patient History    Smoking Tobacco Use: Never    Smokeless Tobacco Use: Unknown    Passive Exposure: Never  Recent Concern: Tobacco Use - Medium Risk (01/15/2024)   Received from [facility]   Patient History    Smoking Tobacco Use: Never    Smokeless Tobacco Use: Former    Passive Exposure: Current  Physicist, Medical Strain: Low Risk (10/15/2022)   Received from [facility]   Overall Financial Resource Strain (CARDIA)    Difficulty of Paying Living Expenses: Not hard at all  Food Insecurity: Low Risk (01/16/2024)   Received from [facility]   Epic    Within the past 12 months, you worried that your food would run out before you got money to buy more: Never true    Within the past 12 months, the  food you bought just didn't last and you didn't have money to get more. : Never true  Transportation Needs: No Transportation Needs (01/16/2024)   Received from [facility]    In the past 12 months, has lack of reliable transportation kept you from medical appointments, meetings, work or from getting things needed for daily living? : No  Physical Activity: Not on file  Stress: Not on file  Social Connections: Not on file  Intimate Partner Violence: Not on file  Depression (EYV7-0): Not on file  Alcohol Screen: Not on file  Housing: Low Risk (01/16/2024)   Received from [facility]   Epic    What is your living situation today?: I have a steady place to live    Think about the place you live. Do you have problems with any of the following? Choose all that apply:: None/None on this list  Utilities: Low Risk (01/16/2024)   Received from [facility]   Utilities  In the past 12 months has the electric, gas, oil, or water company threatened to shut off services in your home? : No  Health Literacy: Not on file     Allergies[1]   Outpatient Medications Prior to Visit  Medication Sig Dispense Refill   albuterol  (VENTOLIN  HFA) 108 (90 Base) MCG/ACT inhaler Inhale 2 puffs into the lungs every 6 (six) hours as needed for wheezing or shortness of breath.     ALPRAZolam  (XANAX ) 0.25 MG tablet Take 0.25 mg by mouth 2 (two) times daily as needed for anxiety.     amitriptyline (ELAVIL) 10 MG tablet Take 10 mg by mouth at bedtime.     cetirizine (ZYRTEC) 10 MG tablet Take 10 mg by mouth daily. (Patient taking differently: Take 10 mg by mouth as needed for allergies.)     Evolocumab 140 MG/ML SOAJ Inject 140 mg into the skin every 14 (fourteen) days.     pantoprazole  (PROTONIX ) 40 MG tablet Take 1 tablet (40 mg total) by mouth daily. Take 30-60 min before first meal of the day 30 tablet 2   SUMAtriptan  (IMITREX ) 50 MG tablet Take 1 tablet (50 mg total) by mouth every 2 (two) hours  as needed for migraine. May repeat in 2 hours if headache persists or recurs. 10 tablet 3   famotidine (PEPCID) 40 MG tablet Take 40 mg by mouth daily. (Patient not taking: Reported on 02/29/2024)     metoprolol  tartrate (LOPRESSOR ) 25 MG tablet Take 1 tablet (25 mg total) by mouth 2 (two) times daily. (Patient not taking: Reported on 02/29/2024) 180 tablet 3   No facility-administered medications prior to visit.    ROS Reviewed all systems and reported negative except as above     Objective:   Vitals:   02/29/24 1100  BP: 124/80  Pulse: 84  SpO2: 98%  Height: 5' 6 (1.676 m)    Physical Exam Physical Exam GENERAL: Appropriate to age, no acute distress. HEAD EYES EARS NOSE THROAT: Moist mucous membranes, atraumatic, normocephalic. CHEST: Clear to auscultation bilaterally, no wheezing, no crackles, no rales CARDIAC: Regular rate and rhythm, normal S1, normal S2, no murmurs, no rubs, no gallops. ABDOMEN: Soft, nontender. NEUROLOGICAL: Motor and sensation grossly intact, alert and oriented times X 3. EXTREMITIES: Warm, well perfused, no edema.     CBC    Component Value Date/Time   WBC 7.8 11/25/2023 1406   RBC 4.83 11/25/2023 1406   HGB 14.6 11/25/2023 1406   HGB 15.7 08/11/2023 1033   HCT 42.2 11/25/2023 1406   HCT 48.7 08/11/2023 1033   PLT 334.0 11/25/2023 1406   PLT 322 08/11/2023 1033   MCV 87.4 11/25/2023 1406   MCV 90 08/11/2023 1033   MCV 90 1Oct 02, 2015 0540   MCH 29.1 08/11/2023 1033   MCH 29.8 03/02/2023 0256   MCHC 34.5 11/25/2023 1406   RDW 13.1 11/25/2023 1406   RDW 14.0 08/11/2023 1033   RDW 13.0 1Oct 02, 2015 0540   LYMPHSABS 1.2 11/25/2023 1406   LYMPHSABS 1.8 01/22/2021 1554   MONOABS 0.7 11/25/2023 1406   EOSABS 0.2 11/25/2023 1406   EOSABS 0.2 01/22/2021 1554   BASOSABS 0.1 11/25/2023 1406   BASOSABS 0.1 01/22/2021 1554     Results Radiology Chest CT (02/2024): Persistent right lung micronodules, improved compared to prior; mediastinal and  hilar lymph nodes within normal limits (Independently interpreted)  Diagnostic Pulmonary function tests (02/2024): Within normal limits   PFT:    Latest Ref Rng & Units 02/29/2024  9:37 AM  PFT Results  FVC-Pre L 5.37   FVC-Predicted Pre % 115   Pre FEV1/FVC % % 74   FEV1-Pre L 4.00   FEV1-Predicted Pre % 108   DLCO uncorrected ml/min/mmHg 27.92   DLCO UNC% % 102   DLCO corrected ml/min/mmHg 27.92   DLCO COR %Predicted % 102   DLVA Predicted % 94   TLC L 7.45   TLC % Predicted % 117   RV % Predicted % 118          Assessment & Plan:   Assessment and Plan Assessment & Plan Pulmonary micronodules likely due to occupational lung exposure CT scan shows improvement in right lung micronodules. Lymph nodes normal. Pulmonary function tests normal. Differential includes inflammation from inhaled substances. Infection unlikely. Previous antibiotics ineffective, likely due to allergy. - Advised wearing a mask while working to prevent further exposure. - Await official radiology read of CT scan and notify if any new findings are identified. - Avoid albuterol  due to previous adverse reaction leading to cardioversion. - Return if symptoms worsen.        Zola Herter, MD  Pulmonary & Critical Care Office: [phone]        [1]  Allergies Allergen Reactions   Cefpodoxime  Anaphylaxis   Penicillins Other (See Comments), Anaphylaxis and Dermatitis    Unknown reaction as a child  Other Reaction(s): Unknown  Other reaction(s): Unknown, Unknown    Unknown reaction as a child    unknown    Other reaction(s): Unknown    Other Reaction(s): Other (See Comments)    unknown Unknown reaction as a child Unknown reaction as a child unknown   Sulfa Antibiotics Other (See Comments), Shortness Of Breath and Anaphylaxis   Doxycycline  Other (See Comments)    Terrible headache  Other reaction(s): Other (See Comments)  Terrible headache   Hyoscyamine Other (See  Comments)    Note Hyoscyamine has sulfa-like components and patient has an allergy to sulfa drugs and sulfa-based antibiotics.  Other reaction(s): Unknown  Note Hyoscyamine has sulfa-like components and patient has an allergy to sulfa drugs and sulfa-based antibiotics.   Lovastatin Other (See Comments)   Sumatriptan      Other Reaction(s): Drowsy   Esomeprazole  Other (See Comments) and Nausea And Vomiting    Other reaction(s): Abdominal pain  Other reaction(s): Abdominal pain  Other reaction(s): Abdominal pain   "

## 2024-02-29 NOTE — Progress Notes (Signed)
 Full PFT performed today.

## 2024-02-29 NOTE — Patient Instructions (Signed)
 Full PFT performed today.

## 2024-06-22 ENCOUNTER — Ambulatory Visit: Admitting: Neurology
